# Patient Record
Sex: Female | Born: 1945
Health system: Southern US, Community
[De-identification: ages and names within clinical notes are randomized; demographics above are authoritative.]

## PROBLEM LIST (undated history)

## (undated) DIAGNOSIS — R002 Palpitations: Secondary | ICD-10-CM

## (undated) DIAGNOSIS — R011 Cardiac murmur, unspecified: Secondary | ICD-10-CM

## (undated) DIAGNOSIS — K859 Acute pancreatitis without necrosis or infection, unspecified: Secondary | ICD-10-CM

## (undated) HISTORY — DX: Palpitations: R00.2

## (undated) HISTORY — DX: Acute pancreatitis without necrosis or infection, unspecified: K85.90

## (undated) HISTORY — DX: Cardiac murmur, unspecified: R01.1

---

## 2001-01-17 ENCOUNTER — Other Ambulatory Visit: Admission: RE | Admit: 2001-01-17 | Discharge: 2001-01-17 | Payer: Self-pay | Admitting: Obstetrics and Gynecology

## 2001-08-24 ENCOUNTER — Encounter: Admission: RE | Admit: 2001-08-24 | Discharge: 2001-08-24 | Payer: Self-pay | Admitting: Internal Medicine

## 2001-08-24 ENCOUNTER — Encounter: Payer: Self-pay | Admitting: Internal Medicine

## 2002-01-31 ENCOUNTER — Other Ambulatory Visit: Admission: RE | Admit: 2002-01-31 | Discharge: 2002-01-31 | Payer: Self-pay | Admitting: Obstetrics and Gynecology

## 2002-11-14 DIAGNOSIS — K625 Hemorrhage of anus and rectum: Secondary | ICD-10-CM | POA: Insufficient documentation

## 2003-02-05 ENCOUNTER — Other Ambulatory Visit: Admission: RE | Admit: 2003-02-05 | Discharge: 2003-02-05 | Payer: Self-pay | Admitting: Obstetrics and Gynecology

## 2003-06-26 ENCOUNTER — Encounter: Payer: Self-pay | Admitting: Gastroenterology

## 2003-06-26 ENCOUNTER — Encounter: Admission: RE | Admit: 2003-06-26 | Discharge: 2003-06-26 | Payer: Self-pay | Admitting: Gastroenterology

## 2005-09-05 ENCOUNTER — Other Ambulatory Visit: Admission: RE | Admit: 2005-09-05 | Discharge: 2005-09-05 | Payer: Self-pay | Admitting: Obstetrics and Gynecology

## 2006-09-12 ENCOUNTER — Other Ambulatory Visit: Admission: RE | Admit: 2006-09-12 | Discharge: 2006-09-12 | Payer: Self-pay | Admitting: Obstetrics and Gynecology

## 2012-05-09 ENCOUNTER — Ambulatory Visit: Payer: Self-pay | Admitting: Obstetrics and Gynecology

## 2012-12-04 ENCOUNTER — Other Ambulatory Visit: Payer: Self-pay | Admitting: Obstetrics and Gynecology

## 2012-12-05 ENCOUNTER — Telehealth: Payer: Self-pay | Admitting: Obstetrics and Gynecology

## 2012-12-05 NOTE — Telephone Encounter (Signed)
Tc to pt. Pt informed rx e-pres to pharm this am. Pt agrees.

## 2013-01-24 ENCOUNTER — Ambulatory Visit: Payer: Self-pay | Admitting: Obstetrics and Gynecology

## 2013-06-14 DIAGNOSIS — K635 Polyp of colon: Secondary | ICD-10-CM | POA: Insufficient documentation

## 2016-11-28 ENCOUNTER — Ambulatory Visit (INDEPENDENT_AMBULATORY_CARE_PROVIDER_SITE_OTHER): Payer: Medicare Other | Admitting: Family Medicine

## 2016-11-28 ENCOUNTER — Encounter: Payer: Self-pay | Admitting: Family Medicine

## 2016-11-28 VITALS — BP 118/80 | HR 73 | Resp 12 | Ht 62.0 in | Wt 117.2 lb

## 2016-11-28 DIAGNOSIS — A6000 Herpesviral infection of urogenital system, unspecified: Secondary | ICD-10-CM | POA: Insufficient documentation

## 2016-11-28 DIAGNOSIS — Z0000 Encounter for general adult medical examination without abnormal findings: Secondary | ICD-10-CM

## 2016-11-28 MED ORDER — ACYCLOVIR 400 MG PO TABS: 400.0000 mg | ORAL_TABLET | Freq: Three times a day (TID) | ORAL | 1 refills | Status: DC

## 2016-11-28 MED ORDER — ACYCLOVIR 400 MG PO TABS
400.0000 mg | ORAL_TABLET | Freq: Three times a day (TID) | ORAL | 1 refills | Status: DC
Start: 2016-11-28 — End: 2016-11-28

## 2016-11-28 NOTE — Progress Notes (Signed)
HPI:   [name] is a 71 y.o. female, who is here today to establish care with me.  Former PCP: Dr Teresa Mcknight. Last preventive routine visit: 10/2016. Last colonoscopy 5 years ago, she is due 10/2017.  She exercises regularly and follows a healthy diet, she is a veggan. She is reporting lab work done within the past year and denies any history of hyperlipidemia or diabetes.  Concerns today: Rx for Valtrex.   + Hx of recurrent genital herpes 1-2 episodes per year, Acyclovir is effective in preventing outbreak. Dx about 15 years ago.  -She is requesting medication to promote eye lashes gowth. She has noted for the past 6 months that her eye lashes are "falling." She denies Hx of alopecia. She has not noted erythema, tenderness, or pruritus.  She followed with ophthalmologist and according to pt, she is "glaucoma suspect", last f/u within a year ago. She has not established with eye care provider, her former one is not longer accepting her health insurance.   She lives with sister and brother, who are also independent, both with hs of multiple myeloma.  Independent ADL's and IADL's. No falls in the past year and denies depression symptoms.   Review of Systems  Constitutional: Negative for activity change, appetite change, fatigue, fever and unexpected weight change.  HENT: Negative for mouth sores, nosebleeds, sore throat and trouble swallowing.   Eyes: Negative for pain, discharge, redness and visual disturbance.  Respiratory: Negative for cough, shortness of breath and wheezing.   Cardiovascular: Negative for chest pain, palpitations and leg swelling.  Gastrointestinal: Negative for abdominal pain, nausea and vomiting.       Negative for changes in bowel habits.  Genitourinary: Negative for decreased urine volume, difficulty urinating and hematuria.  Musculoskeletal: Negative for back pain.  Skin: Negative for rash.  Neurological: Negative for syncope, weakness  and headaches.  Psychiatric/Behavioral: Negative for confusion. The patient is not nervous/anxious.       No current outpatient prescriptions on file prior to visit.   No current facility-administered medications on file prior to visit.      Past Medical History:  Diagnosis Date  . Heart murmur    Not on File  Family History  Problem Relation Age of Onset  . Cancer Neg Hx   . Diabetes Neg Hx     Social History   Social History  . Marital status: Single    Spouse name: N/A  . Number of children: N/A  . Years of education: N/A   Social History Main Topics  . Smoking status: Never Smoker  . Smokeless tobacco: Never Used  . Alcohol use No  . Drug use: No  . Sexual activity: Not Asked   Other Topics Concern  . None   Social History Narrative  . None    Vitals:   11/28/16 0819  BP: 118/80  Pulse: 73  Resp: 12   Body mass index is 21.45 kg/m.  Physical Exam  Nursing note and vitals reviewed. Constitutional: She is oriented to person, place, and time. She appears well-developed and well-nourished. No distress.  HENT:  Head: Atraumatic.  Mouth/Throat: Oropharynx is clear and moist and mucous membranes are normal.  Eyes: Conjunctivae, EOM and lids are normal. Pupils are equal, round, and reactive to light.  Neck: No thyroid mass and no thyromegaly present.  Cardiovascular: Normal rate and regular rhythm.   No murmur heard. Pulses:      Dorsalis pedis pulses are 2+ on  the right side, and 2+ on the left side.  Respiratory: Effort normal and breath sounds normal. No respiratory distress.  GI: Soft. She exhibits no mass. There is no hepatomegaly. There is no tenderness.  Musculoskeletal: She exhibits no edema.  Lymphadenopathy:    She has no cervical adenopathy.  Neurological: She is alert and oriented to person, place, and time. She has normal strength. Coordination and gait normal.  Skin: Skin is warm. No erythema.  Psychiatric: She has a normal mood and  affect.  Well groomed, good eye contact.      ASSESSMENT AND PLAN:     Teresa Mcknight was seen today for establish care.  Diagnoses and all orders for this visit:  Recurrent genital herpes  Stable. No changes in current management. F/U in 12 months.  -     acyclovir (ZOVIRAX) 400 MG tablet; Take 1 tablet (400 mg total) by mouth 3 (three) times daily. Prn for flare ups,start < 48 hrs after sx onset and for 5 days.  Health care maintenance  Next Medicare preventive 10/2017. She refused vaccines, not up to date (zoster,Pneumonia, and influenza). Fall prevention discussed.  In regard to prescription requested for eyelashes growth I would prefer for her eye care provider to prescribe, given her history of possible glaucoma. Encouraged to arrange eye exam appointment.     Teresa Shankles G. [place], MD  [facility]. [place] office.

## 2016-11-28 NOTE — Progress Notes (Signed)
Pre visit review using our clinic review tool, if applicable. No additional management support is needed unless otherwise documented below in the visit note. 

## 2016-11-28 NOTE — Patient Instructions (Signed)
A few things to remember from today's visit:   Recurrent genital herpes - Plan: acyclovir (ZOVIRAX) 400 MG tablet  Continue Acyclovir as needed, start medicating as soon as symptoms of herpes start, less than 48 hours and take it for 5 days at the time. This is not to take daily just when you have a flare up.   Medicare covers a annual preventive visit, which is strongly recommended , it is once per year and involves a series of questions to identify risk factors; so we can try to prevent possible complications. This does not need to be done by a doctor.  We have a nurse Banker(RN) here that is highly qualified to do it, it can be arrange same date you have a follow up appointment with me or labs scheduled, and it 100% covered by Medicare. So before you leave today I would like for you to arrange visit with Teresa Mcknight for Medicare wellness visit.   Please be sure medication list is accurate. If a new problem present, please set up appointment sooner than planned today.

## 2017-11-25 ENCOUNTER — Encounter: Payer: Self-pay | Admitting: Hematology and Oncology

## 2018-10-23 ENCOUNTER — Encounter: Payer: Self-pay | Admitting: Hematology and Oncology

## 2018-10-23 ENCOUNTER — Telehealth: Payer: Self-pay | Admitting: Hematology and Oncology

## 2018-10-23 NOTE — Telephone Encounter (Signed)
New referral received from Dr. Dierdre ForthVanessa [facility]. Pt has been cld and scheduled to see Dr. Pamelia HoitGudena on 1/6 at 1pm. Pt agreed to the appt date and time. Letter mailed.

## 2018-11-14 HISTORY — PX: COLONOSCOPY: SHX174

## 2018-11-19 ENCOUNTER — Telehealth: Payer: Self-pay | Admitting: Hematology and Oncology

## 2018-11-19 ENCOUNTER — Inpatient Hospital Stay: Payer: Medicare Other

## 2018-11-19 ENCOUNTER — Inpatient Hospital Stay: Payer: Medicare Other | Attending: Hematology and Oncology | Admitting: Hematology and Oncology

## 2018-11-19 ENCOUNTER — Other Ambulatory Visit: Payer: Self-pay

## 2018-11-19 DIAGNOSIS — D696 Thrombocytopenia, unspecified: Secondary | ICD-10-CM | POA: Diagnosis not present

## 2018-11-19 DIAGNOSIS — D709 Neutropenia, unspecified: Secondary | ICD-10-CM

## 2018-11-19 DIAGNOSIS — Z79899 Other long term (current) drug therapy: Secondary | ICD-10-CM | POA: Diagnosis not present

## 2018-11-19 LAB — FOLATE: FOLATE: 7.3 ng/mL (ref >5.9)

## 2018-11-19 LAB — CBC WITH DIFFERENTIAL (CANCER CENTER ONLY)
Abs Immature Granulocytes: 0.01 10*3/uL (ref 0.00–0.07)
Basophils Absolute: 0 10*3/uL (ref 0.0–0.1)
Basophils Relative: 0 %
Eosinophils Absolute: 0.3 10*3/uL (ref 0.0–0.5)
Eosinophils Relative: 7 %
HCT: 40.5 % (ref 36.0–46.0)
Hemoglobin: 13 g/dL (ref 12.0–15.0)
Immature Granulocytes: 0 %
Lymphocytes Relative: 47 %
Lymphs Abs: 1.9 10*3/uL (ref 0.7–4.0)
MCH: 30.8 pg (ref 26.0–34.0)
MCHC: 32.1 g/dL (ref 30.0–36.0)
MCV: 96 fL (ref 80.0–100.0)
MONO ABS: 0.3 10*3/uL (ref 0.1–1.0)
Monocytes Relative: 7 %
Neutro Abs: 1.6 10*3/uL — ABNORMAL LOW (ref 1.7–7.7)
Neutrophils Relative %: 39 %
Platelet Count: 132 10*3/uL — ABNORMAL LOW (ref 150–400)
RBC: 4.22 MIL/uL (ref 3.87–5.11)
RDW: 14.6 % (ref 11.5–15.5)
WBC Count: 4 10*3/uL (ref 4.0–10.5)
nRBC: 0 % (ref 0.0–0.2)

## 2018-11-19 LAB — VITAMIN B12: Vitamin B-12: 5858 pg/mL — ABNORMAL HIGH (ref 180–914)

## 2018-11-19 NOTE — Assessment & Plan Note (Signed)
Mild neutropenia: Lab done on 10/04/2018 WBC 3.4, ANC 1.4, platelet count 133, hemoglobin 12.1 I will request for additional labs to compare these levels with historical levels. Before embarking on an extensive work-up, I would like to repeat the CBC with differential today.  Differential diagnosis: 1. Infections: Especially viruses like HIV 2. inflammation/autoimmune causes including lupus/rheumatoid arthritis 3. Nutritional causes but V-07 or folic acid deficiency 4. Medication induced 5. Bone marrow disorders  Workup today: 1. CBC with differential with smear 2. A-17 and folic acid levels 3. ANA 4. Flow cytometry  Return to clinic in 2 weeks to discuss the results and consider bone marrow biopsy if necessary.

## 2018-11-19 NOTE — Progress Notes (Signed)
[place] CONSULT NOTE  Patient Care Team: Haygood, Seymour Bars, MD as PCP - General (Obstetrics and Gynecology)  CHIEF COMPLAINTS/PURPOSE OF CONSULTATION:  Neutropenia and thrombocytopenia  HISTORY OF PRESENTING ILLNESS:  Teresa Mcknight 73 y.o. female is here because of recent diagnosis of mild neutropenia and thrombocytopenia.  Patient had routine blood work that showed an [place] of 1.4 with a total white count of 3.4 and a platelet count of 133.  She brought blood work from 2016 which she had a white count of 3.2 without any differential.  Her platelet count was 12/02/2014.  She takes acyclovir for herpes simplex prevention and has been on it for the past 8 years.  She has not noticed any worsening symptoms of bruising or bleeding or any excessive infections.  She does not eat needs and therefore takes vitamin B12 supplementation.  She is a retired Chief Executive Officer.  I reviewed her records extensively and collaborated the history with the patient.  MEDICAL HISTORY: Herpes simplex type II infections, colon polyps, uterine fibroids, hypercholesterolemia, osteopenia Past Medical History:  Diagnosis Date  . Heart murmur     SURGICAL HISTORY: D&C, colonoscopy 2014 SOCIAL HISTORY: Patient lives by herself, denies any tobacco alcohol or recreational drug use. FAMILY HISTORY: Mother had dementia, sister had hypothyroidism Family History  Problem Relation Age of Onset  . Cancer Neg Hx   . Diabetes Neg Hx     ALLERGIES:  has no allergies on file.  MEDICATIONS:  Current Outpatient Medications  Medication Sig Dispense Refill  . acyclovir (ZOVIRAX) 400 MG tablet Take 1 tablet (400 mg total) by mouth 3 (three) times daily. Prn for flare ups,start < 48 hrs after sx onset and for 5 days. 30 tablet 1   No current facility-administered medications for this visit.   Multivitamin and B12  REVIEW OF SYSTEMS:   Constitutional: Denies fevers, chills or abnormal night sweats Eyes: Denies  blurriness of vision, double vision or watery eyes Ears, nose, mouth, throat, and face: Denies mucositis or sore throat Respiratory: Denies cough, dyspnea or wheezes Cardiovascular: Denies palpitation, chest discomfort or lower extremity swelling Gastrointestinal:  Denies nausea, heartburn or change in bowel habits Skin: Denies abnormal skin rashes Lymphatics: Denies new lymphadenopathy or easy bruising Neurological:Denies numbness, tingling or new weaknesses Behavioral/Psych: Mood is stable, no new changes  Breast: Patient complains that her breasts are different sizes but prior mammograms were normal All other systems were reviewed with the patient and are negative.  PHYSICAL EXAMINATION: ECOG PERFORMANCE STATUS: 0 - Asymptomatic  There were no vitals filed for this visit. There were no vitals filed for this visit.  GENERAL:alert, no distress and comfortable SKIN: skin color, texture, turgor are normal, no rashes or significant lesions EYES: normal, conjunctiva are pink and non-injected, sclera clear OROPHARYNX:no exudate, no erythema and lips, buccal mucosa, and tongue normal  NECK: supple, thyroid normal size, non-tender, without nodularity LYMPH:  no palpable lymphadenopathy in the cervical, axillary or inguinal LUNGS: clear to auscultation and percussion with normal breathing effort HEART: regular rate & rhythm and no murmurs and no lower extremity edema ABDOMEN:abdomen soft, non-tender and normal bowel sounds Musculoskeletal:no cyanosis of digits and no clubbing  PSYCH: alert & oriented x 3 with fluent speech NEURO: no focal motor/sensory deficits   ASSESSMENT AND PLAN:  Neutropenia (HCC) and thrombocytopenia Mild neutropenia: Lab done on 10/04/2018 WBC 3.4, ANC 1.4, platelet count 133, hemoglobin 12.1 I will request for additional labs to compare these levels with historical levels. Before  embarking on an extensive work-up, I would like to repeat the CBC with differential  today.  Differential diagnosis: 1. Infections: Especially viruses like HIV 2. inflammation/autoimmune causes including lupus/rheumatoid arthritis or ITP 3. Nutritional causes but B-91 or folic acid deficiency 4. Medication induced 5. Bone marrow disorders  Workup today: 1. CBC with differential with smear 2. L-68 and folic acid levels 3. ANA If her blood counts remain stable, we may elect to watch and monitor the blood counts every 6 months.  Different sizes of breast: Patient would like me to do a breast exam when she comes back to see me in a week.  Return to clinic in 1 weeks to discuss the results and consider bone marrow biopsy if necessary.      All questions were answered. The patient knows to call the clinic with any problems, questions or concerns.    Harriette Ohara, MD 11/19/18

## 2018-11-19 NOTE — Telephone Encounter (Signed)
Patient decline avs and calendar °

## 2018-11-20 LAB — ANTINUCLEAR ANTIBODIES, IFA: ANA Ab, IFA: NEGATIVE

## 2018-11-21 ENCOUNTER — Encounter: Payer: Self-pay | Admitting: Cardiovascular Disease

## 2018-11-21 ENCOUNTER — Ambulatory Visit (INDEPENDENT_AMBULATORY_CARE_PROVIDER_SITE_OTHER): Payer: Medicare Other | Admitting: Cardiovascular Disease

## 2018-11-21 ENCOUNTER — Encounter

## 2018-11-21 VITALS — BP 114/72 | HR 76 | Ht 65.0 in | Wt 119.6 lb

## 2018-11-21 DIAGNOSIS — R946 Abnormal results of thyroid function studies: Secondary | ICD-10-CM

## 2018-11-21 DIAGNOSIS — R002 Palpitations: Secondary | ICD-10-CM | POA: Insufficient documentation

## 2018-11-21 HISTORY — DX: Palpitations: R00.2

## 2018-11-21 NOTE — Progress Notes (Signed)
Cardiology Office Note   Date:  11/21/2018   ID:  Teresa Mcknight, DOB [birth year], MRN [id number]  PCP:  Hal Morales, MD  Cardiologist:   Chilton Si, MD   Chief Complaint  Patient presents with  . Follow-up      History of Present Illness: Teresa Mcknight is a 73 y.o. female who is being seen today for the evaluation of palpitations at the request of Haygood, Maris Berger, MD.  Teresa Mcknight reports intermittent episodes of feeling like her heart is beating hard.  It always occurs at rest and typically when she awakens from sleep.  It doesn't feel as though her heart is racing or irregular.  She feels her heart beating in the back of her throat and it makes her feel like she is gagging.  It seems to occur randomly.  The most recent episode occurred earlier this week, but it often doesn't occur for several months at a time.  The episodes last for a couple minutes at a time.  They are not associated with chest pain, shortness of breath, lightheadedness or dizziness.  She does at times feel a different sensation that she distinguishes as palpitations.  This occurs with exertion and it feels like her heart is beating fast.  This occurs very rarely.  Teresa Mcknight does yoga three days per week and feels good with exercise.  She has no exertional chest pain, shortness of breath or dizziness.  She follows a healthy diet and has been Vegan for the last 5 years.  At times she has LE edema after sitting for long periods of time but no orthopnea or PND.  She drinks approximately 2 cups of tea daily and doesn't use any over the counter cold or cough medications.  She hasn't been feeling anxious or stressed.  When she was younger she was told that she had an enlarged heart.  She saw a heart specialist >10 years ago and no enlarged heart on echo.  ETT was normal.      Past Medical History:  Diagnosis Date  . Heart murmur   . Palpitations 11/21/2018    History reviewed. No pertinent surgical  history.   Current Outpatient Medications  Medication Sig Dispense Refill  . acyclovir (ZOVIRAX) 400 MG tablet Take 1 tablet (400 mg total) by mouth 3 (three) times daily. Prn for flare ups,start < 48 hrs after sx onset and for 5 days. 30 tablet 1  . Ascorbic Acid (VITAMIN C PO) Take by mouth.    . Menaquinone-7 (VITAMIN K2 PO) Take by mouth daily.    Marland Kitchen VITAMIN B1-B12 PO Take by mouth.     No current facility-administered medications for this visit.     Allergies:   Patient has no allergy information on record.    Social History:  The patient  reports that she has never smoked. She has never used smokeless tobacco. She reports that she does not drink alcohol or use drugs.   Family History:  The patient's family history includes Dementia in her maternal grandmother and mother; Hyperthyroidism in her sister; Hypothyroidism in her brother; Multiple myeloma in her brother and sister; Stroke in her father and mother.    ROS:  Please see the history of present illness.   Otherwise, review of systems are positive for none.   All other systems are reviewed and negative.    PHYSICAL EXAM: VS:  BP 114/72   Pulse 76   Ht 5\' 5"  (1.651 m)  Wt 119 lb 9.6 oz (54.3 kg)   BMI 19.90 kg/m  , BMI Body mass index is 19.9 kg/m. GENERAL:  Well appearing HEENT:  Pupils equal round and reactive, fundi not visualized, oral mucosa unremarkable NECK:  No jugular venous distention, waveform within normal limits, carotid upstroke brisk and symmetric, L carotid bruits LUNGS:  Clear to auscultation bilaterally HEART:  RRR.  PMI not displaced or sustained,S1 and S2 within normal limits, no S3, no S4, no clicks, no rubs, no murmurs ABD:  Flat, positive bowel sounds normal in frequency in pitch, no bruits, no rebound, no guarding, no midline pulsatile mass, no hepatomegaly, no splenomegaly EXT:  2 plus pulses throughout, no edema, no cyanosis no clubbing SKIN:  No rashes no nodules NEURO:  Cranial nerves II  through XII grossly intact, motor grossly intact throughout PSYCH:  Cognitively intact, oriented to person place and time   EKG:  EKG is ordered today. The ekg ordered today demonstrates sinus rhythm.  Rate 76 bpm.   Recent Labs: 11/19/2018: Hemoglobin 13.0; Platelet Count 132   10/04/18: Hemoglobin A1C: 5.4% TSH 4.87 WBC 3.4, hgb 12.1, plt 133 Sodium 145, potassium 4.3, BUN 5, creatinine 0.89 AST 21, ALT 8   Lipid Panel No results found for: CHOL, TRIG, HDL, CHOLHDL, VLDL, LDLCALC, LDLDIRECT    Wt Readings from Last 3 Encounters:  11/21/18 119 lb 9.6 oz (54.3 kg)  11/19/18 120 lb 6.4 oz (54.6 kg)  11/28/16 117 lb 4 oz (53.2 kg)      ASSESSMENT AND PLAN:  # Palpitations: # Elevated TSH: Teresa Mcknight has been awakened intermittently by palpitations.  They occur sporadically less than once per month.  We discussed wearing a monitor, but we are unlikely to catch anything on even a 30 day monitor.  For now she would prefer not to wear a monitor.  Dr. Pennie Rushing checked her thyroid function 09/2018 and TSH was mildly elevated.  We will repeat TSH and free T4.  Other labs were unremarkable and we will not repeat them at this time. We discussed the importance of notifying us if she develops symptoms that are associated with shortness of breath, chest pain, lightheadedness or exertional symptoms.  # L carotid bruit: Check carotid Dopplers.   Current medicines are reviewed at length with the patient today.  The patient does not have concerns regarding medicines.  The following changes have been made:  no change  Labs/ tests ordered today include:   Orders Placed This Encounter  Procedures  . T4, free  . TSH  . EKG 12-Lead     Disposition:   FU with Cap Massi C. Duke Salvia, MD, [facility] in 1 year.     Signed, Chester Romero C. Duke Salvia, MD, [facility]  11/21/2018 10:16 PM    [facility]

## 2018-11-21 NOTE — Patient Instructions (Signed)
Medication Instructions:  No changes  If you need a refill on your cardiac medications before your next appointment, please call your pharmacy.   Lab work: TSH/FT4 TODAY   If you have labs (blood work) drawn today and your tests are completely normal, you will receive your results only by: Marland Kitchen MyChart Message (if you have MyChart) OR . A paper copy in the mail If you have any lab test that is abnormal or we need to change your treatment, we will call you to review the results.  Testing/Procedures: NONE  Follow-Up: At [facility], you and your health needs are our priority.  As part of our continuing mission to provide you with exceptional heart care, we have created designated Provider Care Teams.  These Care Teams include your primary Cardiologist (physician) and Advanced Practice Providers (APPs -  Physician Assistants and Nurse Practitioners) who all work together to provide you with the care you need, when you need it. You will need a follow up appointment in 12 months.  Please call our office 2 months in advance to schedule this appointment.  You may see DR RANDOOLH or one of the following Advanced Practice Providers on your designated Care Team:   Corine Shelter, PA-C [place], [place] . Marjie Skiff, PA-C

## 2018-11-22 LAB — TSH: TSH: 2.03 u[IU]/mL (ref 0.450–4.500)

## 2018-11-22 LAB — T4, FREE: Free T4: 0.89 ng/dL (ref 0.82–1.77)

## 2018-11-28 NOTE — Progress Notes (Signed)
Patient Care Team: Eldred Manges, MD as PCP - General (Obstetrics and Gynecology)  DIAGNOSIS:    ICD-10-CM   1. Neutropenia, unspecified type ([place]) D70.9 CBC with Differential ([facility] Only)    CHIEF COMPLIANT: Follow-up of Neutropenia and thrombocytopenia  INTERVAL HISTORY: Teresa Mcknight is a 73 y.o. with above-mentioned history of neutropenia and thrombocytopenia. She presents to the clinic alone today to discuss recent lab work which was negative for lupus or thyroid issues, platelets 132, neutrophils 1.6. She reports she is vegan and takes a B12 supplement and Vitamin K supplement for bone health but will switch to Vitamin D. She expressed interest in having a breast exam today because her breasts are different sizes and feel heavy. Her most recent mammogram from 11/25/17 showed no evidence of malignancy. She prefers to follow-up here in a year as opposed to with her PCP. She reviewed her medication list with me.  REVIEW OF SYSTEMS:   Constitutional: Denies fevers, chills or abnormal weight loss Eyes: Denies blurriness of vision Ears, nose, mouth, throat, and face: Denies mucositis or sore throat Respiratory: Denies cough, dyspnea or wheezes Cardiovascular: Denies palpitation, chest discomfort Gastrointestinal:  Denies nausea, heartburn or change in bowel habits Skin: Denies abnormal skin rashes Lymphatics: Denies new lymphadenopathy or easy bruising Neurological: Denies numbness, tingling or new weaknesses Behavioral/Psych: Mood is stable, no new changes  Extremities: No lower extremity edema Breast: denies any pain or lumps or nodules in either breasts All other systems were reviewed with the patient and are negative.  I have reviewed the past medical history, past surgical history, social history and family history with the patient and they are unchanged from previous note.  ALLERGIES:  has no allergies on file.  MEDICATIONS:  Current Outpatient Medications    Medication Sig Dispense Refill  . acyclovir (ZOVIRAX) 400 MG tablet Take 1 tablet (400 mg total) by mouth 3 (three) times daily. Prn for flare ups,start < 48 hrs after sx onset and for 5 days. 30 tablet 1  . Ascorbic Acid (VITAMIN C PO) Take by mouth.    Marland Kitchen VITAMIN B1-B12 PO Take by mouth.     No current facility-administered medications for this visit.     PHYSICAL EXAMINATION: ECOG PERFORMANCE STATUS: 0 - Asymptomatic  Vitals:   11/30/18 0904  BP: 131/75  Pulse: 75  Resp: 17  Temp: 97.8 F (36.6 C)  SpO2: 100%   Filed Weights   11/30/18 0904  Weight: 119 lb 4.8 oz (54.1 kg)    GENERAL: alert, no distress and comfortable SKIN: skin color, texture, turgor are normal, no rashes or significant lesions EYES: normal, Conjunctiva are pink and non-injected, sclera clear OROPHARYNX: no exudate, no erythema and lips, buccal mucosa, and tongue normal  NECK: supple, thyroid normal size, non-tender, without nodularity LYMPH: no palpable lymphadenopathy in the cervical, axillary or inguinal LUNGS: clear to auscultation and percussion with normal breathing effort HEART: regular rate & rhythm and no murmurs and no lower extremity edema ABDOMEN: abdomen soft, non-tender and normal bowel sounds MUSCULOSKELETAL: no cyanosis of digits and no clubbing  NEURO: alert & oriented x 3 with fluent speech, no focal motor/sensory deficits EXTREMITIES: No lower extremity edema BREAST: No palpable masses or nodules in either right or left breasts. No palpable axillary supraclavicular or infraclavicular adenopathy no breast tenderness or nipple discharge. (exam performed in the presence of a chaperone)  LABORATORY DATA:  I have reviewed the data as listed No flowsheet data found.  Lab Results  Component Value Date   WBC 4.0 11/19/2018   HGB 13.0 11/19/2018   HCT 40.5 11/19/2018   MCV 96.0 11/19/2018   PLT 132 (L) 11/19/2018   NEUTROABS 1.6 (L) 11/19/2018    ASSESSMENT & PLAN:  Neutropenia  (HCC) Mild neutropenia:  10/04/2018: WBC 3.4, ANC 1.4, platelet count 133, hemoglobin 12.1 11/19/2018: WBC 4, ANC 1.6, platelets 132, hemoglobin 13, few atypical lymphs ANA: Negative, O24 and folic acid were normal, TSH and thyroid function: Normal  I discussed with the patient that there has been an improvement in the total white blood cell count and ANC. Since the decrease in the [place] is so mild, I do not recommend any other interventions at this time. However if the ANC drops below 1.0, we will need to do a bone marrow biopsy.  Breast issues: Patient complains that 1 of the breast is larger than the other. Mammogram 11/21/2018: Benign breast density category B Exam findings did not reveal any particular cause of concern. I recommended watchful monitoring with annual mammograms.  Return to clinic on an as-needed basis      Orders Placed This Encounter  Procedures  . CBC with Differential ([facility] Only)    Standing Status:   Future    Standing Expiration Date:   12/01/2019   The patient has a good understanding of the overall plan. she agrees with it. she will call with any problems that may develop before the next visit here.  Nicholas Lose, MD 11/30/2018  Julious Oka Dorshimer am acting as scribe for Dr. Nicholas Lose.  I have reviewed the above documentation for accuracy and completeness, and I agree with the above.

## 2018-11-30 ENCOUNTER — Telehealth: Payer: Self-pay | Admitting: Cardiovascular Disease

## 2018-11-30 ENCOUNTER — Inpatient Hospital Stay (HOSPITAL_BASED_OUTPATIENT_CLINIC_OR_DEPARTMENT_OTHER): Payer: Medicare Other | Admitting: Hematology and Oncology

## 2018-11-30 ENCOUNTER — Telehealth: Payer: Self-pay | Admitting: Hematology and Oncology

## 2018-11-30 DIAGNOSIS — D696 Thrombocytopenia, unspecified: Secondary | ICD-10-CM | POA: Diagnosis not present

## 2018-11-30 DIAGNOSIS — Z79899 Other long term (current) drug therapy: Secondary | ICD-10-CM

## 2018-11-30 DIAGNOSIS — D709 Neutropenia, unspecified: Secondary | ICD-10-CM

## 2018-11-30 NOTE — Telephone Encounter (Signed)
Patient is calling because she has not heard from anyone regarding her test results

## 2018-11-30 NOTE — Telephone Encounter (Signed)
Gave avs and calendar ° °

## 2018-11-30 NOTE — Assessment & Plan Note (Addendum)
Mild neutropenia:  10/04/2018: WBC 3.4, ANC 1.4, platelet count 133, hemoglobin 12.1 11/19/2018: WBC 4, ANC 1.6, platelets 132, hemoglobin 13, few atypical lymphs ANA: Negative, O60 and folic acid were normal, TSH and thyroid function: Normal  I discussed with the patient that there has been an improvement in the total white blood cell count and ANC. Since the decrease in the [place] is so mild, I do not recommend any other interventions at this time. However if the ANC drops below 1.0, we will need to do a bone marrow biopsy.  Breast issues: Patient complains that 1 of the breast is larger than the other. Mammogram 11/21/2018: Benign breast density category B Exam findings did not reveal any particular cause of concern. I recommended watchful monitoring with annual mammograms.  Return to clinic on an as-needed basis

## 2018-11-30 NOTE — Telephone Encounter (Signed)
Returned call to patient advised recent thyroid labs normal.Patient requested copy of results to be mailed.Results mailed.

## 2018-12-05 ENCOUNTER — Telehealth: Payer: Self-pay | Admitting: Cardiovascular Disease

## 2018-12-05 NOTE — Telephone Encounter (Signed)
Returned call to patient no answer.LMTC. 

## 2018-12-05 NOTE — Telephone Encounter (Signed)
New Message:    Please call, question about her lab work she had on the 9th of January she thinks.

## 2018-12-07 NOTE — Telephone Encounter (Signed)
Called patient, she would like another letter with ranges.  I will mail about the results to the patient. No other questions.

## 2019-03-26 ENCOUNTER — Telehealth: Payer: Self-pay

## 2019-03-26 NOTE — Telephone Encounter (Signed)
   [facility] Pre-operative Risk Assessment    Request for surgical clearance:  1. What type of surgery is being performed? Colonoscopy    2. When is this surgery scheduled? 04/03/19  3. What type of clearance is required (medical clearance vs. Pharmacy clearance to hold med vs. Both)? Medical  4. Are there any medications that need to be held prior to surgery and how long? N/A  5. Practice name and name of physician performing surgery? [facility] (Dr. Oletta Lamas)  6. What is your office phone number [phone]    7.   What is your office fax number [phone]  8.   Anesthesia type (None, local, MAC, general) ? Propofol    Meryl Crutch 03/26/2019, 10:08 AM  _________________________________________________________________   (provider comments below)

## 2019-03-26 NOTE — Telephone Encounter (Signed)
   Primary Cardiologist: Chilton Si, MD  Chart reviewed as part of pre-operative protocol coverage. Patient was contacted 03/26/2019 in reference to pre-operative risk assessment for pending surgery as outlined below.  Teresa Mcknight was last seen on 11/21/18 by Dr. Duke Salvia.  Since that day, Teresa Mcknight has done well. She does not have a history of heart disease or heart failure. She denies new cardiac symptoms, including angina. She can complete more than 4.0 METS. She is low risk for colonoscopy.  Therefore, based on ACC/AHA guidelines, the patient would be at acceptable risk for the planned procedure without further cardiovascular testing.   I will route this recommendation to the requesting party via Epic fax function and remove from pre-op pool.  Please call with questions.  Roe Rutherford Duke, PA 03/26/2019, 10:23 AM

## 2019-06-17 ENCOUNTER — Other Ambulatory Visit: Payer: Self-pay

## 2019-06-18 ENCOUNTER — Telehealth: Payer: Self-pay

## 2019-06-18 NOTE — Telephone Encounter (Signed)
Erroneous entry

## 2019-06-19 ENCOUNTER — Telehealth: Payer: Self-pay

## 2019-06-19 NOTE — Telephone Encounter (Signed)
Pt called to request appointment regarding "swelling in lymph nodes on left side."  Pt denies any fever, fatigue, or recent illness. Pt reports swelling goes down once she takes Ibuprofen.  Reports symptoms have been present for several weeks.

## 2019-06-19 NOTE — Telephone Encounter (Signed)
Patient scheduled to follow up with MD regarding lymph node swelling.  Pt aware.

## 2019-06-20 ENCOUNTER — Inpatient Hospital Stay (HOSPITAL_COMMUNITY): Payer: Medicare Other

## 2019-06-20 ENCOUNTER — Emergency Department (HOSPITAL_COMMUNITY): Payer: Medicare Other

## 2019-06-20 ENCOUNTER — Encounter (HOSPITAL_COMMUNITY): Payer: Self-pay | Admitting: *Deleted

## 2019-06-20 ENCOUNTER — Inpatient Hospital Stay (HOSPITAL_COMMUNITY)
Admission: EM | Admit: 2019-06-20 | Discharge: 2019-06-24 | DRG: 440 | Disposition: A | Discharge status: Home / Self Care | Payer: Medicare Other | Attending: Internal Medicine | Admitting: Internal Medicine

## 2019-06-20 ENCOUNTER — Encounter (HOSPITAL_COMMUNITY): Payer: Self-pay

## 2019-06-20 ENCOUNTER — Other Ambulatory Visit: Payer: Self-pay

## 2019-06-20 ENCOUNTER — Ambulatory Visit (INDEPENDENT_AMBULATORY_CARE_PROVIDER_SITE_OTHER)
Admission: EM | Admit: 2019-06-20 | Discharge: 2019-06-20 | Disposition: A | Discharge status: Home / Self Care | Payer: Medicare Other | Source: Home / Self Care

## 2019-06-20 DIAGNOSIS — Z807 Family history of other malignant neoplasms of lymphoid, hematopoietic and related tissues: Secondary | ICD-10-CM | POA: Diagnosis not present

## 2019-06-20 DIAGNOSIS — R1013 Epigastric pain: Secondary | ICD-10-CM | POA: Diagnosis not present

## 2019-06-20 DIAGNOSIS — K8591 Acute pancreatitis with uninfected necrosis, unspecified: Secondary | ICD-10-CM | POA: Diagnosis not present

## 2019-06-20 DIAGNOSIS — Z91011 Allergy to milk products: Secondary | ICD-10-CM | POA: Diagnosis not present

## 2019-06-20 DIAGNOSIS — E876 Hypokalemia: Secondary | ICD-10-CM | POA: Diagnosis present

## 2019-06-20 DIAGNOSIS — Z888 Allergy status to other drugs, medicaments and biological substances status: Secondary | ICD-10-CM | POA: Diagnosis not present

## 2019-06-20 DIAGNOSIS — K859 Acute pancreatitis without necrosis or infection, unspecified: Secondary | ICD-10-CM | POA: Diagnosis not present

## 2019-06-20 DIAGNOSIS — Z91040 Latex allergy status: Secondary | ICD-10-CM

## 2019-06-20 DIAGNOSIS — Z886 Allergy status to analgesic agent status: Secondary | ICD-10-CM

## 2019-06-20 DIAGNOSIS — Z79899 Other long term (current) drug therapy: Secondary | ICD-10-CM

## 2019-06-20 DIAGNOSIS — K8501 Idiopathic acute pancreatitis with uninfected necrosis: Principal | ICD-10-CM | POA: Diagnosis present

## 2019-06-20 DIAGNOSIS — Z20828 Contact with and (suspected) exposure to other viral communicable diseases: Secondary | ICD-10-CM | POA: Diagnosis present

## 2019-06-20 LAB — CBC
HCT: 40.5 % (ref 36.0–46.0)
Hemoglobin: 13 g/dL (ref 12.0–15.0)
MCH: 31.5 pg (ref 26.0–34.0)
MCHC: 32.1 g/dL (ref 30.0–36.0)
MCV: 98.1 fL (ref 80.0–100.0)
Platelets: 145 10*3/uL — ABNORMAL LOW (ref 150–400)
RBC: 4.13 MIL/uL (ref 3.87–5.11)
RDW: 14.3 % (ref 11.5–15.5)
WBC: 8.5 10*3/uL (ref 4.0–10.5)
nRBC: 0 % (ref 0.0–0.2)

## 2019-06-20 LAB — COMPREHENSIVE METABOLIC PANEL
ALT: 20 U/L (ref 0–44)
AST: 37 U/L (ref 15–41)
Albumin: 3.2 g/dL — ABNORMAL LOW (ref 3.5–5.0)
Alkaline Phosphatase: 82 U/L (ref 38–126)
Anion gap: 12 (ref 5–15)
BUN: 10 mg/dL (ref 8–23)
CO2: 23 mmol/L (ref 22–32)
Calcium: 7.9 mg/dL — ABNORMAL LOW (ref 8.9–10.3)
Chloride: 102 mmol/L (ref 98–111)
Creatinine, Ser: 0.94 mg/dL (ref 0.44–1.00)
GFR calc Af Amer: >60 mL/min (ref >60)
GFR calc non Af Amer: >60 mL/min (ref >60)
Glucose, Bld: 141 mg/dL — ABNORMAL HIGH (ref 70–99)
Potassium: 3.4 mmol/L — ABNORMAL LOW (ref 3.5–5.1)
Sodium: 137 mmol/L (ref 135–145)
Total Bilirubin: 0.7 mg/dL (ref 0.3–1.2)
Total Protein: 6.4 g/dL — ABNORMAL LOW (ref 6.5–8.1)

## 2019-06-20 LAB — URINALYSIS, ROUTINE W REFLEX MICROSCOPIC
Bacteria, UA: NONE SEEN
Bilirubin Urine: NEGATIVE
Glucose, UA: 50 mg/dL — AB
Hgb urine dipstick: NEGATIVE
Ketones, ur: 80 mg/dL — AB
Leukocytes,Ua: NEGATIVE
Nitrite: NEGATIVE
Protein, ur: 100 mg/dL — AB
Specific Gravity, Urine: 1.027 (ref 1.005–1.030)
pH: 6 (ref 5.0–8.0)

## 2019-06-20 LAB — TRIGLYCERIDES: Triglycerides: 49 mg/dL (ref <150)

## 2019-06-20 LAB — SARS CORONAVIRUS 2 (TAT 6-24 HRS): SARS Coronavirus 2: NEGATIVE

## 2019-06-20 LAB — LIPASE, BLOOD: Lipase: 275 U/L — ABNORMAL HIGH (ref 11–51)

## 2019-06-20 MED ORDER — ONDANSETRON HCL 4 MG/2ML IJ SOLN: 4.0000 mg | Freq: Four times a day (QID) | INTRAMUSCULAR | Status: DC | PRN

## 2019-06-20 MED ORDER — ACETAMINOPHEN 325 MG PO TABS
650.0000 mg | ORAL_TABLET | Freq: Four times a day (QID) | ORAL | Status: DC | PRN
  Administered 2019-06-21 – 2019-06-23 (×5): 650 mg via ORAL
  Filled 2019-06-20 (×7): qty 2

## 2019-06-20 MED ORDER — ONDANSETRON HCL 4 MG/2ML IJ SOLN
4.0000 mg | Freq: Once | INTRAMUSCULAR | Status: AC
  Administered 2019-06-20: 4 mg via INTRAVENOUS
  Filled 2019-06-20: qty 2

## 2019-06-20 MED ORDER — MORPHINE SULFATE (PF) 2 MG/ML IV SOLN
2.0000 mg | INTRAVENOUS | Status: DC | PRN
  Administered 2019-06-20: 2 mg via INTRAVENOUS
  Filled 2019-06-20: qty 1

## 2019-06-20 MED ORDER — ACETAMINOPHEN 650 MG RE SUPP: 650.0000 mg | Freq: Four times a day (QID) | RECTAL | Status: DC | PRN

## 2019-06-20 MED ORDER — SODIUM CHLORIDE 0.9 % IV SOLN
INTRAVENOUS | Status: DC
  Administered 2019-06-20 – 2019-06-23 (×5): via INTRAVENOUS

## 2019-06-20 MED ORDER — POLYETHYLENE GLYCOL 3350 17 G PO PACK: 17.0000 g | PACK | Freq: Every day | ORAL | Status: DC | PRN

## 2019-06-20 MED ORDER — FENTANYL CITRATE (PF) 100 MCG/2ML IJ SOLN
50.0000 ug | Freq: Once | INTRAMUSCULAR | Status: AC
  Administered 2019-06-20: 12:00:00 50 ug via INTRAVENOUS
  Filled 2019-06-20: qty 2

## 2019-06-20 MED ORDER — SENNA 8.6 MG PO TABS
1.0000 | ORAL_TABLET | Freq: Two times a day (BID) | ORAL | Status: DC
  Administered 2019-06-20 – 2019-06-23 (×2): 8.6 mg via ORAL
  Filled 2019-06-20 (×5): qty 1

## 2019-06-20 MED ORDER — SODIUM CHLORIDE 0.9 % IV BOLUS
500.0000 mL | Freq: Once | INTRAVENOUS | Status: AC
  Administered 2019-06-20: 500 mL via INTRAVENOUS

## 2019-06-20 MED ORDER — ONDANSETRON HCL 4 MG PO TABS: 4.0000 mg | ORAL_TABLET | Freq: Four times a day (QID) | ORAL | Status: DC | PRN

## 2019-06-20 MED ORDER — POTASSIUM CHLORIDE CRYS ER 20 MEQ PO TBCR
40.0000 meq | EXTENDED_RELEASE_TABLET | Freq: Once | ORAL | Status: AC
  Administered 2019-06-20: 40 meq via ORAL
  Filled 2019-06-20: qty 2

## 2019-06-20 MED ORDER — SODIUM CHLORIDE 0.9% FLUSH
3.0000 mL | Freq: Once | INTRAVENOUS | Status: AC
  Administered 2019-06-20: 3 mL via INTRAVENOUS

## 2019-06-20 MED ORDER — IOHEXOL 300 MG/ML  SOLN
100.0000 mL | Freq: Once | INTRAMUSCULAR | Status: AC | PRN
  Administered 2019-06-20: 100 mL via INTRAVENOUS

## 2019-06-20 MED ORDER — FENTANYL CITRATE (PF) 100 MCG/2ML IJ SOLN
50.0000 ug | Freq: Once | INTRAMUSCULAR | Status: AC
  Administered 2019-06-20: 14:00:00 50 ug via INTRAVENOUS
  Filled 2019-06-20: qty 2

## 2019-06-20 MED ORDER — ENOXAPARIN SODIUM 40 MG/0.4ML ~~LOC~~ SOLN
40.0000 mg | SUBCUTANEOUS | Status: DC
  Administered 2019-06-21: 40 mg via SUBCUTANEOUS
  Filled 2019-06-20 (×4): qty 0.4

## 2019-06-20 NOTE — ED Provider Notes (Signed)
[facility] EMERGENCY DEPARTMENT Provider Note   CSN: [id number] Arrival date & time: 06/20/19  0272    History   Chief Complaint Chief Complaint  Patient presents with   Abdominal Pain    HPI Teresa Mcknight is a 73 y.o. female.     Patient is a 73 year old female who presents with abdominal pain.  She states she has had a history of abdominal pain for the last 3 days.  She describes it as a crampy pain under her diaphragm.  She also hurts in the lower part of her abdomen.  It got worse last night and through today.  She has had multiple episodes of nonbilious, nonbloody emesis.  She denies any fevers.  She had some constipation earlier but took a laxative yesterday and had a good bowel movement yesterday.  She denies any urinary symptoms.  She denies any prior abdominal surgeries.  She was seen in urgent care and sent here for further evaluation.  She did have labs done in urgent care which shows a mildly elevated lipase but otherwise on remarkable.     Past Medical History:  Diagnosis Date   Heart murmur    Palpitations 11/21/2018    Patient Active Problem List   Diagnosis Date Noted   Palpitations 11/21/2018   Neutropenia ([place]) 11/19/2018   Recurrent genital herpes 11/28/2016    History reviewed. No pertinent surgical history.   OB History   No obstetric history on file.      Home Medications    Prior to Admission medications   Medication Sig Start Date End Date Taking? Authorizing Provider  acyclovir (ZOVIRAX) 400 MG tablet Take 1 tablet (400 mg total) by mouth 3 (three) times daily. Prn for flare ups,start < 48 hrs after sx onset and for 5 days. Patient taking differently: Take 200 mg by mouth daily. Prn for flare ups,start < 48 hrs after sx onset and for 5 days. 11/28/16  Yes [place], Betty G, MD  Ascorbic Acid (VITAMIN C PO) Take 500 mg by mouth at bedtime.    Yes [provider]  VITAMIN B1-B12 PO Take 5,000 mg by mouth at  bedtime.    Yes [provider]  Cholecalciferol (VITAMIN D3) 50 MCG (2000 UT) TABS Take 2,000 Units by mouth at bedtime.    [provider]    Family History Family History  Problem Relation Age of Onset   Dementia Mother    Stroke Mother    Stroke Father    Multiple myeloma Sister    Multiple myeloma Brother    Dementia Maternal Grandmother    Hypothyroidism Brother    Hyperthyroidism Sister    Cancer Neg Hx    Diabetes Neg Hx     Social History Social History   Tobacco Use   Smoking status: Never Smoker   Smokeless tobacco: Never Used  Substance Use Topics   Alcohol use: No   Drug use: No     Allergies   Aspirin, Dairy aid [lactase], Latex, and Valtrex [valacyclovir hcl]   Review of Systems Review of Systems  Constitutional: Negative for chills, diaphoresis, fatigue and fever.  HENT: Negative for congestion, rhinorrhea and sneezing.   Eyes: Negative.   Respiratory: Negative for cough, chest tightness and shortness of breath.   Cardiovascular: Negative for chest pain and leg swelling.  Gastrointestinal: Positive for abdominal pain, nausea and vomiting. Negative for blood in stool and diarrhea.  Genitourinary: Negative for difficulty urinating, flank pain, frequency and hematuria.  Musculoskeletal: Negative for arthralgias and back pain.  Skin: Negative for rash.  Neurological: Negative for dizziness, speech difficulty, weakness, numbness and headaches.     Physical Exam Updated Vital Signs BP 130/82    Pulse 99    Temp 99.2 F (37.3 C) (Oral)    Resp 14    SpO2 100%   Physical Exam Constitutional:      Appearance: She is well-developed.  HENT:     Head: Normocephalic and atraumatic.  Eyes:     Pupils: Pupils are equal, round, and reactive to light.  Neck:     Musculoskeletal: Normal range of motion and neck supple.  Cardiovascular:     Rate and Rhythm: Normal rate and regular rhythm.     Heart sounds: Normal heart  sounds.  Pulmonary:     Effort: Pulmonary effort is normal. No respiratory distress.     Breath sounds: Normal breath sounds. No wheezing or rales.  Chest:     Chest wall: No tenderness.  Abdominal:     General: Bowel sounds are normal.     Palpations: Abdomen is soft.     Tenderness: There is abdominal tenderness in the epigastric area. There is no guarding or rebound.  Musculoskeletal: Normal range of motion.  Lymphadenopathy:     Cervical: No cervical adenopathy.  Skin:    General: Skin is warm and dry.     Findings: No rash.  Neurological:     Mental Status: She is alert and oriented to person, place, and time.      ED Treatments / Results  Labs (all labs ordered are listed, but only abnormal results are displayed) Labs Reviewed  LIPASE, BLOOD - Abnormal; Notable for the following components:      Result Value   Lipase 275 (*)    All other components within normal limits  COMPREHENSIVE METABOLIC PANEL - Abnormal; Notable for the following components:   Potassium 3.4 (*)    Glucose, Bld 141 (*)    Calcium 7.9 (*)    Total Protein 6.4 (*)    Albumin 3.2 (*)    All other components within normal limits  CBC - Abnormal; Notable for the following components:   Platelets 145 (*)    All other components within normal limits  URINALYSIS, ROUTINE W REFLEX MICROSCOPIC - Abnormal; Notable for the following components:   Glucose, UA 50 (*)    Ketones, ur 80 (*)    Protein, ur 100 (*)    All other components within normal limits  SARS CORONAVIRUS 2    EKG EKG Interpretation  Date/Time:  Thursday June 20 2019 09:48:12 EDT Ventricular Rate:  65 PR Interval:  130 QRS Duration: 82 QT Interval:  444 QTC Calculation: 461 R Axis:   76 Text Interpretation:  Normal sinus rhythm Normal ECG Confirmed by Malvin Johns [phone]) on 06/20/2019 11:29:15 AM   Radiology Ct Abdomen Pelvis W Contrast  Result Date: 06/20/2019 CLINICAL DATA:  Upper abdominal pain, bloating, distention  EXAM: CT ABDOMEN AND PELVIS WITH CONTRAST TECHNIQUE: Multidetector CT imaging of the abdomen and pelvis was performed using the standard protocol following bolus administration of intravenous contrast. CONTRAST:  137m OMNIPAQUE IOHEXOL 300 MG/ML  SOLN COMPARISON:  None. FINDINGS: Lower chest: No acute abnormality. Hepatobiliary: No focal liver abnormality is seen. No gallstones, gallbladder wall thickening, or biliary dilatation. Pancreas: Pancreas is diffusely enlarged and edematous with extensive peripancreatic stranding and fluid. Areas of heterogeneous enhancement within the body/tail suggesting early necrosis. More focal fluid  collection along the inferior aspect of the pancreatic body and tail with thin incomplete rim enhancement (series 3, images 32-36). Spleen: Normal in size without focal abnormality. Adrenals/Urinary Tract: Adrenal glands are unremarkable. Kidneys are normal, without renal calculi, focal lesion, or hydronephrosis. Bladder is unremarkable. Stomach/Bowel: Diffuse submucosal edema of the gastric wall, likely reactive secondary to adjacent inflammation within the pancreas. There is also wall thickening throughout the duodenum. No dilated loops of bowel. Vascular/Lymphatic: No significant vascular findings are present. No enlarged abdominal or pelvic lymph nodes. Reproductive: Enlarged uterus with multiple heterogeneous, partially calcified fibroids. Other: Small volume ascites.  No abdominal wall hernia. Musculoskeletal: No acute or significant osseous findings. IMPRESSION: 1. Acute interstitial edematous pancreatitis. Areas of heterogeneous enhancement within the pancreatic body and tail suggestive of early necrosis. Moderate amount of peripancreatic fluid without well-defined capsule or rim enhancement. 2. Thickening and edema of the gastric and duodenal walls, likely reactive secondary to adjacent inflammation. These results were called by telephone at the time of interpretation on  06/20/2019 at 1:41 pm to Dr. Threasa Beards Daneisha Surges , who verbally acknowledged these results. Electronically Signed   By: Davina Poke M.D.   On: 06/20/2019 13:43    Procedures Procedures (including critical care time)  Medications Ordered in ED Medications  sodium chloride flush (NS) 0.9 % injection 3 mL (has no administration in time range)  fentaNYL (SUBLIMAZE) injection 50 mcg (has no administration in time range)  fentaNYL (SUBLIMAZE) injection 50 mcg (50 mcg Intravenous Given 06/20/19 1228)  ondansetron (ZOFRAN) injection 4 mg (4 mg Intravenous Given 06/20/19 1227)  sodium chloride 0.9 % bolus 500 mL (0 mLs Intravenous Stopped 06/20/19 1357)  iohexol (OMNIPAQUE) 300 MG/ML solution 100 mL (100 mLs Intravenous Contrast Given 06/20/19 1309)     Initial Impression / Assessment and Plan / ED Course  I have reviewed the triage vital signs and the nursing notes.  Pertinent labs & imaging results that were available during my care of the patient were reviewed by me and considered in my medical decision making (see chart for details).        Patient is a 73 year old female with no significant past medical history who has upper abdominal pain.  Her lipase is elevated at 275.  Her other LFTs are normal.  Her white count is normal.  CT scan shows evidence of acute pancreatitis with evidence of necrosis of the pancreatic tail and surrounding edema.  Her vital signs are stable.  She was given pain control medications.  I spoke with the hospitalist to admit the patient for further treatment.  Final Clinical Impressions(s) / ED Diagnoses   Final diagnoses:  Acute pancreatitis with uninfected necrosis, unspecified pancreatitis type    ED Discharge Orders    None       Malvin Johns, MD 06/20/19 1413

## 2019-06-20 NOTE — Assessment & Plan Note (Signed)
Mild neutropenia:  10/04/2018: WBC 3.4, ANC 1.4, platelet count 133, hemoglobin 12.1 11/19/2018: WBC 4, ANC 1.6, platelets 132, hemoglobin 13, few atypical lymphs ANA: Negative, C38 and folic acid were normal, TSH and thyroid function: Normal 06/27/2019:    if the ANC drops below 1.0, we will need to do a bone marrow biopsy. Return to clinic every 6 months with labs and follow-up

## 2019-06-20 NOTE — ED Triage Notes (Signed)
Pt reports abdominal pain and bloated for 3 days. Pt states that she vomited last night.

## 2019-06-20 NOTE — ED Notes (Signed)
Patient transported to Ultrasound 

## 2019-06-20 NOTE — ED Notes (Signed)
ED TO INPATIENT HANDOFF REPORT  ED Nurse Name and Phone #: [phone]  S Name/Age/Gender Gerda DissLoystean Santino 73 y.o. female Room/Bed: 041C/041C  Code Status   Code Status: Not on file  Home/SNF/Other Home Patient oriented to: self, place, time and situation Is this baseline? Yes   Triage Complete: Triage complete  Chief Complaint ABD PAIN  Triage Note Pt reports abdominal pain and bloated for 3 days. Pt states that she vomited last night.    Allergies Allergies  Allergen Reactions  . Aspirin Other (See Comments)    Sore stomach  . Dairy Aid [Lactase]     Causes Mucus  . Latex Itching  . Valtrex [Valacyclovir Hcl] Nausea And Vomiting    Level of Care/Admitting Diagnosis ED Disposition    ED Disposition Condition Comment   Admit  [facility] [[id number]]  Level of Care: Med-Surg [16]  Covid Evaluation: Asymptomatic Screening Protocol (No Symptoms)  Diagnosis: Acute pancreatitis [577.0.ICD-9-CM]  Admitting Physician: Chiquita LothELGERGAWY, DAWOOD S [[id number]]  Attending Physician: Randol KernELGERGAWY, DAWOOD S [[id number]]  Estimated length of stay: past midnight tomorrow  Certification:: I certify this patient will need inpatient services for at least 2 midnights  PT Class (Do Not Modify): Inpatient [101]  PT Acc Code (Do Not Modify): Private [1]       B Medical/Surgery History Past Medical History:  Diagnosis Date  . Heart murmur   . Palpitations 11/21/2018   History reviewed. No pertinent surgical history.   A IV Location/Drains/Wounds Patient Lines/Drains/Airways Status   Active Line/Drains/Airways    Name:   Placement date:   Placement time:   Site:   Days:   Peripheral IV 06/20/19 Right Antecubital   06/20/19    1226    Antecubital   less than 1          Intake/Output Last 24 hours  Intake/Output Summary (Last 24 hours) at 06/20/2019 1502 Last data filed at 06/20/2019 1357 Gross per 24 hour  Intake 500 ml  Output -  Net 500 ml     Labs/Imaging Results for orders placed or performed during the hospital encounter of 06/20/19 (from the past 48 hour(s))  Urinalysis, Routine w reflex microscopic     Status: Abnormal   Collection Time: 06/20/19  9:44 AM  Result Value Ref Range   Color, Urine YELLOW YELLOW   APPearance CLEAR CLEAR   Specific Gravity, Urine 1.027 1.005 - 1.030   pH 6.0 5.0 - 8.0   Glucose, UA 50 (A) NEGATIVE mg/dL   Hgb urine dipstick NEGATIVE NEGATIVE   Bilirubin Urine NEGATIVE NEGATIVE   Ketones, ur 80 (A) NEGATIVE mg/dL   Protein, ur 322100 (A) NEGATIVE mg/dL   Nitrite NEGATIVE NEGATIVE   Leukocytes,Ua NEGATIVE NEGATIVE   RBC / HPF 11-20 0 - 5 RBC/hpf   WBC, UA 0-5 0 - 5 WBC/hpf   Bacteria, UA NONE SEEN NONE SEEN   Squamous Epithelial / LPF 0-5 0 - 5   Mucus PRESENT     Comment: Performed at [facility], [address] [place], [place] [address]  Lipase, blood     Status: Abnormal   Collection Time: 06/20/19  9:47 AM  Result Value Ref Range   Lipase 275 (H) 11 - 51 U/L    Comment: Performed at [facility], [address] [place], [place] [address]  Comprehensive metabolic panel     Status: Abnormal   Collection Time: 06/20/19  9:47 AM  Result Value Ref Range  Sodium 137 135 - 145 mmol/L   Potassium 3.4 (L) 3.5 - 5.1 mmol/L   Chloride 102 98 - 111 mmol/L   CO2 23 22 - 32 mmol/L   Glucose, Bld 141 (H) 70 - 99 mg/dL   BUN 10 8 - 23 mg/dL   Creatinine, Ser 8.750.94 0.44 - 1.00 mg/dL   Calcium 7.9 (L) 8.9 - 10.3 mg/dL   Total Protein 6.4 (L) 6.5 - 8.1 g/dL   Albumin 3.2 (L) 3.5 - 5.0 g/dL   AST 37 15 - 41 U/L   ALT 20 0 - 44 U/L   Alkaline Phosphatase 82 38 - 126 U/L   Total Bilirubin 0.7 0.3 - 1.2 mg/dL   GFR calc non Af Amer >60 >60 mL/min   GFR calc Af Amer >60 >60 mL/min   Anion gap 12 5 - 15    Comment: Performed at [facility], [address] [place], [place] [address]  CBC     Status: Abnormal   Collection Time: 06/20/19  9:47 AM  Result Value Ref Range    WBC 8.5 4.0 - 10.5 K/uL   RBC 4.13 3.87 - 5.11 MIL/uL   Hemoglobin 13.0 12.0 - 15.0 g/dL   HCT 95.140.5 88.436.0 - 16.646.0 %   MCV 98.1 80.0 - 100.0 fL   MCH 31.5 26.0 - 34.0 pg   MCHC 32.1 30.0 - 36.0 g/dL   RDW 06.314.3 01.611.5 - 01.015.5 %   Platelets 145 (L) 150 - 400 K/uL    Comment: REPEATED TO VERIFY   nRBC 0.0 0.0 - 0.2 %    Comment: Performed at [facility], [address] [place], [place] [address]   Ct Abdomen Pelvis W Contrast  Result Date: 06/20/2019 CLINICAL DATA:  Upper abdominal pain, bloating, distention EXAM: CT ABDOMEN AND PELVIS WITH CONTRAST TECHNIQUE: Multidetector CT imaging of the abdomen and pelvis was performed using the standard protocol following bolus administration of intravenous contrast. CONTRAST:  100mL OMNIPAQUE IOHEXOL 300 MG/ML  SOLN COMPARISON:  None. FINDINGS: Lower chest: No acute abnormality. Hepatobiliary: No focal liver abnormality is seen. No gallstones, gallbladder wall thickening, or biliary dilatation. Pancreas: Pancreas is diffusely enlarged and edematous with extensive peripancreatic stranding and fluid. Areas of heterogeneous enhancement within the body/tail suggesting early necrosis. More focal fluid collection along the inferior aspect of the pancreatic body and tail with thin incomplete rim enhancement (series 3, images 32-36). Spleen: Normal in size without focal abnormality. Adrenals/Urinary Tract: Adrenal glands are unremarkable. Kidneys are normal, without renal calculi, focal lesion, or hydronephrosis. Bladder is unremarkable. Stomach/Bowel: Diffuse submucosal edema of the gastric wall, likely reactive secondary to adjacent inflammation within the pancreas. There is also wall thickening throughout the duodenum. No dilated loops of bowel. Vascular/Lymphatic: No significant vascular findings are present. No enlarged abdominal or pelvic lymph nodes. Reproductive: Enlarged uterus with multiple heterogeneous, partially calcified fibroids. Other: Small volume  ascites.  No abdominal wall hernia. Musculoskeletal: No acute or significant osseous findings. IMPRESSION: 1. Acute interstitial edematous pancreatitis. Areas of heterogeneous enhancement within the pancreatic body and tail suggestive of early necrosis. Moderate amount of peripancreatic fluid without well-defined capsule or rim enhancement. 2. Thickening and edema of the gastric and duodenal walls, likely reactive secondary to adjacent inflammation. These results were called by telephone at the time of interpretation on 06/20/2019 at 1:41 pm to Dr. Shawna OrleansMELANIE BELFI , who verbally acknowledged these results. Electronically Signed   By: Duanne GuessNicholas  Plundo M.D.   On: 06/20/2019 13:43  Pending Labs Unresulted Labs (From admission, onward)    Start     Ordered   06/20/19 1441  Triglycerides  Once,   STAT     06/20/19 1441   06/20/19 1348  SARS CORONAVIRUS 2 Nasal Swab Aptima Multi Swab  (Asymptomatic/Tier 2 Patients Labs)  Once,   STAT    Question Answer Comment  Is this test for diagnosis or screening Screening   Symptomatic for COVID-19 as defined by CDC No   Hospitalized for COVID-19 No   Admitted to ICU for COVID-19 No   Previously tested for COVID-19 No   Resident in a congregate (group) care setting No   Employed in healthcare setting No   Pregnant No      06/20/19 1347   Signed and Held  CBC  (enoxaparin (LOVENOX)    CrCl >/= 30 ml/min)  Once,   R    Comments: Baseline for enoxaparin therapy IF NOT ALREADY DRAWN.  Notify MD if PLT < 100 K.    Signed and Held   Signed and Held  Creatinine, serum  (enoxaparin (LOVENOX)    CrCl >/= 30 ml/min)  Once,   R    Comments: Baseline for enoxaparin therapy IF NOT ALREADY DRAWN.    Signed and Held   Signed and Held  Creatinine, serum  (enoxaparin (LOVENOX)    CrCl >/= 30 ml/min)  Weekly,   R    Comments: while on enoxaparin therapy    Signed and Held   Signed and Held  Comprehensive metabolic panel  Tomorrow morning,   R     Signed and Held    Signed and Held  CBC  Tomorrow morning,   R     Signed and Held          Vitals/Pain Today's Vitals   06/20/19 1300 06/20/19 1350 06/20/19 1352 06/20/19 1400  BP: 138/65   130/82  Pulse: 62 63 99   Resp: (!) 31 18 19 14   Temp:      TempSrc:      SpO2: 100% 100% 100%   PainSc:        Isolation Precautions No active isolations  Medications Medications  sodium chloride flush (NS) 0.9 % injection 3 mL (has no administration in time range)  0.9 %  sodium chloride infusion (has no administration in time range)  morphine 2 MG/ML injection 2 mg (has no administration in time range)  potassium chloride SA (K-DUR) CR tablet 40 mEq (has no administration in time range)  fentaNYL (SUBLIMAZE) injection 50 mcg (50 mcg Intravenous Given 06/20/19 1228)  ondansetron (ZOFRAN) injection 4 mg (4 mg Intravenous Given 06/20/19 1227)  sodium chloride 0.9 % bolus 500 mL (0 mLs Intravenous Stopped 06/20/19 1357)  iohexol (OMNIPAQUE) 300 MG/ML solution 100 mL (100 mLs Intravenous Contrast Given 06/20/19 1309)  fentaNYL (SUBLIMAZE) injection 50 mcg (50 mcg Intravenous Given 06/20/19 1418)    Mobility walks Low fall risk   Focused Assessments GI   R Recommendations: See Admitting Provider Note  Report given to:   Additional Notes: .

## 2019-06-20 NOTE — ED Triage Notes (Signed)
Pt states she has abdominal pain and vomiting  X 3 days. Pt states the vomiting started last night.

## 2019-06-20 NOTE — H&P (Addendum)
TRH H&P   Patient Demographics:    Teresa Mcknight, is a 73 y.o. female  MRN: [id number]   DOB - [birth year]  Admit Date - 06/20/2019  Outpatient Primary MD for the patient is Haygood, Seymour Bars, MD  Referring MD/NP/PA: Dr Tamera Punt  Outpatient Specialists: GI Dr Beather Arbour  Patient coming from: home  Chief Complaint  Patient presents with  . Abdominal Pain      HPI:    Teresa Mcknight  is a 73 y.o. female, without significant past medical history, she presents to urgent care today secondary to complaints of epigastric abdominal pain, bloating, distention, over last 3 days, and nausea and vomiting over last 24 hours, worse abdominal pain is progressive, sharp, epigastric, continuous, she started to vomit yesterday, bilious vomiting, x5, ports overall she is constipated, requiring laxatives, denies fever, chills, coffee-ground emesis or diarrhea. - in ED lipase were elevated at 275, potassium low at 3.4, ED abdomen and pelvis significant for otitis with early necrosis, denies any history of alcohol use, no history of gallstones, I was called to admit.    Review of systems:    In addition to the HPI above,  No Fever-chills, No Headache, No changes with Vision or hearing, No problems swallowing food or Liquids, No Chest pain, Cough or Shortness of Breath, Complains of  Abdominal pain, Nausea and Vommitting, reports constipation. No Blood in stool or Urine, No dysuria, No new skin rashes or bruises, No new joints pains-aches,  No new weakness, tingling, numbness in any extremity, No recent weight gain or loss, No polyuria, polydypsia or polyphagia, No significant Mental Stressors.  A full 10 point Review of Systems was done, except as stated above, all other Review of Systems were negative.   With Past History of the following :    Past Medical History:  Diagnosis Date  . Heart  murmur   . Palpitations 11/21/2018      History reviewed. No pertinent surgical history.    Social History:     Social History   Tobacco Use  . Smoking status: Never Smoker  . Smokeless tobacco: Never Used  Substance Use Topics  . Alcohol use: No      Family History :     Family History  Problem Relation Age of Onset  . Dementia Mother   . Stroke Mother   . Stroke Father   . Multiple myeloma Sister   . Multiple myeloma Brother   . Dementia Maternal Grandmother   . Hypothyroidism Brother   . Hyperthyroidism Sister   . Cancer Neg Hx   . Diabetes Neg Hx       Home Medications:   Prior to Admission medications   Medication Sig Start Date End Date Taking? Authorizing Provider  acyclovir (ZOVIRAX) 400 MG tablet Take 1 tablet (400 mg total) by mouth 3 (three) times daily. Prn for flare ups,start < 48 hrs  after sx onset and for 5 days. Patient taking differently: Take 200 mg by mouth daily. Prn for flare ups,start < 48 hrs after sx onset and for 5 days. 11/28/16  Yes Martinique, Betty G, MD  Ascorbic Acid (VITAMIN C PO) Take 500 mg by mouth at bedtime.    Yes [provider]  VITAMIN B1-B12 PO Take 5,000 mg by mouth at bedtime.    Yes [provider]  Cholecalciferol (VITAMIN D3) 50 MCG (2000 UT) TABS Take 2,000 Units by mouth at bedtime.    [provider]     Allergies:     Allergies  Allergen Reactions  . Aspirin Other (See Comments)    Sore stomach  . Dairy Aid [Lactase]     Causes Mucus  . Latex Itching  . Valtrex [Valacyclovir Hcl] Nausea And Vomiting     Physical Exam:   Vitals  Blood pressure 130/82, pulse 99, temperature 99.2 F (37.3 C), temperature source Oral, resp. rate 14, SpO2 100 %.   1. General  Well developed lying in bed in NAD,  2. Normal affect and insight, Not Suicidal or Homicidal, Awake Alert, Oriented X 3.  3. No F.N deficits, ALL C.Nerves Intact, Strength 5/5 all 4 extremities, Sensation intact all 4  extremities, Plantars down going.  4. Ears and Eyes appear Normal, Conjunctivae clear, PERRLA. Moist Oral Mucosa.  5. Supple Neck, No JVD, No cervical lymphadenopathy appriciated, No Carotid Bruits.  6. Symmetrical Chest wall movement, Good air movement bilaterally, CTAB.  7. RRR, No Gallops, Rubs or Murmurs, No Parasternal Heave.  8. Positive Bowel Sounds, epigastic  tenderness, No organomegaly appriciated,No rebound -guarding or rigidity.  9.  No Cyanosis, Normal Skin Turgor, No Skin Rash or Bruise.  10. Good muscle tone,  joints appear normal , no effusions, Normal ROM.  11. No Palpable Lymph Nodes in Neck or Axillae   Data Review:    CBC Recent Labs  Lab 06/20/19 0947  WBC 8.5  HGB 13.0  HCT 40.5  PLT 145*  MCV 98.1  MCH 31.5  MCHC 32.1  RDW 14.3   ------------------------------------------------------------------------------------------------------------------  Chemistries  Recent Labs  Lab 06/20/19 0947  NA 137  K 3.4*  CL 102  CO2 23  GLUCOSE 141*  BUN 10  CREATININE 0.94  CALCIUM 7.9*  AST 37  ALT 20  ALKPHOS 82  BILITOT 0.7   ------------------------------------------------------------------------------------------------------------------ CrCl cannot be calculated (Unknown ideal weight.). ------------------------------------------------------------------------------------------------------------------ No results for input(s): TSH, T4TOTAL, T3FREE, THYROIDAB in the last 72 hours.  Invalid input(s): FREET3  Coagulation profile No results for input(s): INR, PROTIME in the last 168 hours. ------------------------------------------------------------------------------------------------------------------- No results for input(s): DDIMER in the last 72 hours. -------------------------------------------------------------------------------------------------------------------  Cardiac Enzymes No results for input(s): CKMB, TROPONINI, MYOGLOBIN in the  last 168 hours.  Invalid input(s): CK ------------------------------------------------------------------------------------------------------------------ No results found for: BNP   ---------------------------------------------------------------------------------------------------------------  Urinalysis    Component Value Date/Time   COLORURINE YELLOW 06/20/2019 [place] 06/20/2019 0944   LABSPEC 1.027 06/20/2019 0944   PHURINE 6.0 06/20/2019 0944   GLUCOSEU 50 (A) 06/20/2019 0944   HGBUR NEGATIVE 06/20/2019 0944   BILIRUBINUR NEGATIVE 06/20/2019 0944   KETONESUR 80 (A) 06/20/2019 0944   PROTEINUR 100 (A) 06/20/2019 0944   NITRITE NEGATIVE 06/20/2019 0944   LEUKOCYTESUR NEGATIVE 06/20/2019 0944    ----------------------------------------------------------------------------------------------------------------   Imaging Results:    Ct Abdomen Pelvis W Contrast  Result Date: 06/20/2019 CLINICAL DATA:  Upper abdominal pain, bloating, distention EXAM: CT ABDOMEN AND  PELVIS WITH CONTRAST TECHNIQUE: Multidetector CT imaging of the abdomen and pelvis was performed using the standard protocol following bolus administration of intravenous contrast. CONTRAST:  192m OMNIPAQUE IOHEXOL 300 MG/ML  SOLN COMPARISON:  None. FINDINGS: Lower chest: No acute abnormality. Hepatobiliary: No focal liver abnormality is seen. No gallstones, gallbladder wall thickening, or biliary dilatation. Pancreas: Pancreas is diffusely enlarged and edematous with extensive peripancreatic stranding and fluid. Areas of heterogeneous enhancement within the body/tail suggesting early necrosis. More focal fluid collection along the inferior aspect of the pancreatic body and tail with thin incomplete rim enhancement (series 3, images 32-36). Spleen: Normal in size without focal abnormality. Adrenals/Urinary Tract: Adrenal glands are unremarkable. Kidneys are normal, without renal calculi, focal lesion, or  hydronephrosis. Bladder is unremarkable. Stomach/Bowel: Diffuse submucosal edema of the gastric wall, likely reactive secondary to adjacent inflammation within the pancreas. There is also wall thickening throughout the duodenum. No dilated loops of bowel. Vascular/Lymphatic: No significant vascular findings are present. No enlarged abdominal or pelvic lymph nodes. Reproductive: Enlarged uterus with multiple heterogeneous, partially calcified fibroids. Other: Small volume ascites.  No abdominal wall hernia. Musculoskeletal: No acute or significant osseous findings. IMPRESSION: 1. Acute interstitial edematous pancreatitis. Areas of heterogeneous enhancement within the pancreatic body and tail suggestive of early necrosis. Moderate amount of peripancreatic fluid without well-defined capsule or rim enhancement. 2. Thickening and edema of the gastric and duodenal walls, likely reactive secondary to adjacent inflammation. These results were called by telephone at the time of interpretation on 06/20/2019 at 1:41 pm to Dr. MThreasa BeardsBELFI , who verbally acknowledged these results. Electronically Signed   By: NDavina PokeM.D.   On: 06/20/2019 13:43    My personal review of EKG: Rhythm NSR, Rate  65 /min, QTc 461 , no Acute ST changes   Assessment & Plan:    Active Problems:   Acute pancreatitis  Acute pancreatitis -Patient presents with abdominal pain, nausea, vomiting for last 3 days, the abdomen and pelvis showing evidence early necrosis in the pancreatic body and tail, with peripancreatic fluid, surrounding gastric and duodenal wall edema, most likely inflammation from pancreatitis. -Lipase elevated at 279, repeat in a.m. -LFTs within normal limit, will obtain right upper quadrant ultrasound to rule out any gallstones -No history of alcohol use -Keep n.p.o., will keep on IV fluids, will keep on PRN nausea and pain medications. -We will check triglyceride level -Discussed with GI Dr. GPenelope Coop further  recommendation, but she needs to follow with Dr. EOletta Lamasfrom [facility] ensure there is no malignancy after her inflammation subsides.  Hypokalemia -Repleted   DVT Prophylaxis Lovenox-  SCDs  AM Labs Ordered, also please review Full Orders  Family Communication: Admission, patients condition and plan of care including tests being ordered have been discussed with the patient  who indicate understanding and agree with the plan and Code Status.  Code Status Full  Likely DC to  Home  Condition GUARDED    Consults called: D/W GI Dr. GPenelope Coopvia phone  Admission status: inpatient  Time spent in minutes : 50 minutes   DPhillips ClimesM.D on 06/20/2019 at 2:30 PM  Between 7am to 7pm - Pager - [phone] After 7pm go to [email or web address] - password [facility] - Office  [phone]

## 2019-06-20 NOTE — ED Notes (Signed)
Pt. Transported to CT 

## 2019-06-20 NOTE — ED Provider Notes (Signed)
[place]    CSN: [id number] Arrival date & time: 06/20/19  0815     History   Chief Complaint Chief Complaint  Patient presents with  . Abdominal Pain    HPI Teresa Mcknight is a 73 y.o. female.   Patient is a 73 year old female with no significant past medical history.  She presents today with upper abdominal discomfort, bloating and distention.  This is been present and worsening over the past 3 days.  She started vomiting last night.  Vomited 5 times.  Reported vomiting clear and burning liquid.  Last bowel movement was yesterday.  She took a laxative Tuesday due to constipation.  Seem to have a pretty good bowel movement yesterday.  Denies any hematemesis or hematochezia.  Denies any diarrhea or fevers.  Reporting some sweats and chills last night. ROS per HPI      Past Medical History:  Diagnosis Date  . Heart murmur   . Palpitations 11/21/2018    Patient Active Problem List   Diagnosis Date Noted  . Palpitations 11/21/2018  . Neutropenia ([place]) 11/19/2018  . Recurrent genital herpes 11/28/2016    History reviewed. No pertinent surgical history.  OB History   No obstetric history on file.      Home Medications    Prior to Admission medications   Medication Sig Start Date End Date Taking? Authorizing Provider  acyclovir (ZOVIRAX) 400 MG tablet Take 1 tablet (400 mg total) by mouth 3 (three) times daily. Prn for flare ups,start < 48 hrs after sx onset and for 5 days. 11/28/16   [place], Betty G, MD  Ascorbic Acid (VITAMIN C PO) Take by mouth.    [provider]  VITAMIN B1-B12 PO Take by mouth.    [provider]    Family History Family History  Problem Relation Age of Onset  . Dementia Mother   . Stroke Mother   . Stroke Father   . Multiple myeloma Sister   . Multiple myeloma Brother   . Dementia Maternal Grandmother   . Hypothyroidism Brother   . Hyperthyroidism Sister   . Cancer Neg Hx   . Diabetes Neg Hx      Social History Social History   Tobacco Use  . Smoking status: Never Smoker  . Smokeless tobacco: Never Used  Substance Use Topics  . Alcohol use: No  . Drug use: No     Allergies   Patient has no known allergies.   Review of Systems Review of Systems   Physical Exam Triage Vital Signs ED Triage Vitals  Enc Vitals Group     BP 06/20/19 0859 122/66     Pulse Rate 06/20/19 0859 65     Resp 06/20/19 0859 16     Temp 06/20/19 0859 98.4 F (36.9 C)     Temp Source 06/20/19 0859 Oral     SpO2 06/20/19 0859 100 %     Weight 06/20/19 0857 110 lb (49.9 kg)     Height --      Head Circumference --      Peak Flow --      Pain Score 06/20/19 0857 10     Pain Loc --      Pain Edu? --      Excl. in [place]? --    No data found.  Updated Vital Signs BP 122/66 (BP Location: Right Arm)   Pulse 65   Temp 98.4 F (36.9 C) (Oral)   Resp 16   Wt  110 lb (49.9 kg)   SpO2 100%   BMI 18.30 kg/m   Visual Acuity Right Eye Distance:   Left Eye Distance:   Bilateral Distance:    Right Eye Near:   Left Eye Near:    Bilateral Near:     Physical Exam Vitals signs and nursing note reviewed.  Constitutional:      General: She is not in acute distress.    Appearance: She is well-developed. She is ill-appearing. She is not toxic-appearing.  HENT:     Head: Normocephalic and atraumatic.  Cardiovascular:     Rate and Rhythm: Normal rate and regular rhythm.  Pulmonary:     Effort: Pulmonary effort is normal.  Abdominal:     General: Bowel sounds are decreased. There is distension.     Palpations: There is no hepatomegaly or splenomegaly.     Tenderness: There is abdominal tenderness in the right upper quadrant and epigastric area. There is no right CVA tenderness.  Skin:    General: Skin is warm and dry.  Neurological:     General: No focal deficit present.     Mental Status: She is alert.  Psychiatric:        Mood and Affect: Mood normal.      UC Treatments / Results   Labs (all labs ordered are listed, but only abnormal results are displayed) Labs Reviewed - No data to display  EKG   Radiology No results found.  Procedures Procedures (including critical care time)  Medications Ordered in UC Medications - No data to display  Initial Impression / Assessment and Plan / UC Course  I have reviewed the triage vital signs and the nursing notes.  Pertinent labs & imaging results that were available during my care of the patient were reviewed by me and considered in my medical decision making (see chart for details).     Patient is a 87-year female presents today with upper abdominal discomfort.  She has very distended abdomen and tender to palpation.  Patient is ill-appearing.  Vomited 5 times since last night. Concern for bowel obstruction or some other  etiology.  Patient need CT scan. Sending to the ER for further evaluation and management. Vital signs are stable here in clinic Patient being pushed down in a wheelchair Final Clinical Impressions(s) / UC Diagnoses   Final diagnoses:  Epigastric pain     Discharge Instructions     Please go to the ER for further evaluation.     ED Prescriptions    None     Controlled Substance Prescriptions [place] Controlled Substance Registry consulted? Not Applicable   Orvan July, NP 06/20/19 [phone]

## 2019-06-20 NOTE — Discharge Instructions (Addendum)
Please go to the ER for further evaluation °

## 2019-06-20 NOTE — ED Notes (Signed)
Patient transported to CT 

## 2019-06-20 NOTE — ED Notes (Signed)
Admitting Provider at bedside. 

## 2019-06-21 DIAGNOSIS — K8501 Idiopathic acute pancreatitis with uninfected necrosis: Principal | ICD-10-CM

## 2019-06-21 LAB — CBC
HCT: 38.2 % (ref 36.0–46.0)
Hemoglobin: 12.6 g/dL (ref 12.0–15.0)
MCH: 31.7 pg (ref 26.0–34.0)
MCHC: 33 g/dL (ref 30.0–36.0)
MCV: 96 fL (ref 80.0–100.0)
Platelets: 144 10*3/uL — ABNORMAL LOW (ref 150–400)
RBC: 3.98 MIL/uL (ref 3.87–5.11)
RDW: 14.4 % (ref 11.5–15.5)
WBC: 8.3 10*3/uL (ref 4.0–10.5)
nRBC: 0 % (ref 0.0–0.2)

## 2019-06-21 LAB — COMPREHENSIVE METABOLIC PANEL
ALT: 17 U/L (ref 0–44)
AST: 34 U/L (ref 15–41)
Albumin: 2.5 g/dL — ABNORMAL LOW (ref 3.5–5.0)
Alkaline Phosphatase: 53 U/L (ref 38–126)
Anion gap: 11 (ref 5–15)
BUN: 16 mg/dL (ref 8–23)
CO2: 19 mmol/L — ABNORMAL LOW (ref 22–32)
Calcium: 7.4 mg/dL — ABNORMAL LOW (ref 8.9–10.3)
Chloride: 106 mmol/L (ref 98–111)
Creatinine, Ser: 1.03 mg/dL — ABNORMAL HIGH (ref 0.44–1.00)
GFR calc Af Amer: >60 mL/min (ref >60)
GFR calc non Af Amer: 55 mL/min — ABNORMAL LOW (ref >60)
Glucose, Bld: 95 mg/dL (ref 70–99)
Potassium: 4.2 mmol/L (ref 3.5–5.1)
Sodium: 136 mmol/L (ref 135–145)
Total Bilirubin: 0.9 mg/dL (ref 0.3–1.2)
Total Protein: 5.2 g/dL — ABNORMAL LOW (ref 6.5–8.1)

## 2019-06-21 MED ORDER — SIMETHICONE 80 MG PO CHEW
80.0000 mg | CHEWABLE_TABLET | Freq: Four times a day (QID) | ORAL | Status: DC | PRN
  Administered 2019-06-21 – 2019-06-23 (×5): 80 mg via ORAL
  Filled 2019-06-21 (×6): qty 1

## 2019-06-21 NOTE — Progress Notes (Signed)
PROGRESS NOTE    [name]  ZOX:[id number] DOB: 06/08/1946 DOA: 06/20/2019 PCP: Hal Morales, MD    Brief Narrative:   Teresa Mcknight  is a 73 y.o. female, without significant past medical history, she presents to urgent care today secondary to complaints of epigastric abdominal pain, bloating, distention, over last 3 days, and nausea and vomiting over last 24 hours, worse abdominal pain is progressive, sharp, epigastric, continuous, she started to vomit yesterday, bilious vomiting, x5, reports constipated, requiring laxatives, denies fever, chills, coffee-ground emesis or diarrhea.  In ED, lipase was elevated at 275, potassium low at 3.4, ED abdomen and pelvis significant for pancreatitis with early necrosis, denies any history of alcohol use, no history of gallstones, I was called to admit.   Assessment & Plan:   Active Problems:   Acute pancreatitis  Acute idiopathic pancreatitis with necrosis Patient presenting with epigastric abdominal pain that is been progressing over the past 24 hours associated with bilious nausea and vomiting.  Lipase noted to be elevated at 275 on admission with CT abdomen/pelvis significant for extensive peripancreatic stranding and fluid and enhancement within the body/tail suggestive of early necrosis.  No gallstones or gallbladder wall thickening noted or biliary dilation.  Lipase within normal limits and no history of alcohol abuse. --N.p.o. with ice chips/sips of water with medications --NS at 150 mL's per hour --Patient declines narcotics for pain control and wishes to utilize Tylenol --Follow lipase daily --Supportive care --Case was discussed with [facility] on call, Dr. Evette Cristal, by admitting hospitalist and no other recommendations at this time.  Will need follow-up with her primary gastroenterologist, Dr. Randa Evens following discharge to ensure there is no malignancy once inflammation subsides.  Hypokalemia Repleted. --Repeat electrolytes in  a.m. to include magnesium.   DVT prophylaxis: Lovenox / SCDs Code Status: Full code Family Communication: None Disposition Plan: Continue inpatient hospitalization, IV fluid hydration, antiemetics, pain control   Consultants:   None  Procedures:   None  Antimicrobials:   None   Subjective: Patient seen and examined at bedside, resting comfortably in bedside chair.  Continues with epigastric pain with radiation towards the back.  Continues with poor appetite and nausea.  Continues on IV fluids for hydration.  Patient states does not want to utilize any narcotics due to side effects and wants to utilize Tylenol for pain control.  No other complaints or concerns at this time.  Denies headache, no fever/chills/night sweats, no vomiting/diarrhea, no chest pain, no palpitations, no shortness of breath, no weakness, no issues with bowel/bladder function, no paresthesias.  No acute events overnight per nurse staff.  Objective: Vitals:   06/20/19 1644 06/20/19 1955 06/21/19 0348 06/21/19 0942  BP: 124/78 112/63 95/63 100/60  Pulse: 87 (!) 104 (!) 111 96  Resp: 18 18 18    Temp: 98.5 F (36.9 C) 100 F (37.8 C) 99 F (37.2 C) 99.1 F (37.3 C)  TempSrc: Oral Oral Oral Oral  SpO2: 100% 100% 99% 100%    Intake/Output Summary (Last 24 hours) at 06/21/2019 1323 Last data filed at 06/20/2019 1357 Gross per 24 hour  Intake 500 ml  Output -  Net 500 ml   There were no vitals filed for this visit.  Examination:  General exam: Appears calm and comfortable  Respiratory system: Clear to auscultation. Respiratory effort normal. Cardiovascular system: S1 & S2 heard, RRR. No JVD, murmurs, rubs, gallops or clicks. No pedal edema. Gastrointestinal system: Abdomen is nondistended, mild epigastric tenderness, soft. No organomegaly or masses felt.  Normal bowel sounds heard. Central nervous system: Alert and oriented. No focal neurological deficits. Extremities: Symmetric 5 x 5 power. Skin: No  rashes, lesions or ulcers Psychiatry: Judgement and insight appear normal. Mood & affect appropriate.     Data Reviewed: I have personally reviewed following labs and imaging studies  CBC: Recent Labs  Lab 06/20/19 0947 06/21/19 0223  WBC 8.5 8.3  HGB 13.0 12.6  HCT 40.5 38.2  MCV 98.1 96.0  PLT 145* 144*   Basic Metabolic Panel: Recent Labs  Lab 06/20/19 0947 06/21/19 0223  NA 137 136  K 3.4* 4.2  CL 102 106  CO2 23 19*  GLUCOSE 141* 95  BUN 10 16  CREATININE 0.94 1.03*  CALCIUM 7.9* 7.4*   GFR: CrCl cannot be calculated (Unknown ideal weight.). Liver Function Tests: Recent Labs  Lab 06/20/19 0947 06/21/19 0223  AST 37 34  ALT 20 17  ALKPHOS 82 53  BILITOT 0.7 0.9  PROT 6.4* 5.2*  ALBUMIN 3.2* 2.5*   Recent Labs  Lab 06/20/19 0947  LIPASE 275*   No results for input(s): AMMONIA in the last 168 hours. Coagulation Profile: No results for input(s): INR, PROTIME in the last 168 hours. Cardiac Enzymes: No results for input(s): CKTOTAL, CKMB, CKMBINDEX, TROPONINI in the last 168 hours. BNP (last 3 results) No results for input(s): PROBNP in the last 8760 hours. HbA1C: No results for input(s): HGBA1C in the last 72 hours. CBG: No results for input(s): GLUCAP in the last 168 hours. Lipid Profile: Recent Labs    06/20/19 1658  TRIG 49   Thyroid Function Tests: No results for input(s): TSH, T4TOTAL, FREET4, T3FREE, THYROIDAB in the last 72 hours. Anemia Panel: No results for input(s): VITAMINB12, FOLATE, FERRITIN, TIBC, IRON, RETICCTPCT in the last 72 hours. Sepsis Labs: No results for input(s): PROCALCITON, LATICACIDVEN in the last 168 hours.  Recent Results (from the past 240 hour(s))  SARS CORONAVIRUS 2 Nasal Swab Aptima Multi Swab     Status: None   Collection Time: 06/20/19  1:57 PM   Specimen: Aptima Multi Swab; Nasal Swab  Result Value Ref Range Status   SARS Coronavirus 2 NEGATIVE NEGATIVE Final    Comment: (NOTE) SARS-CoV-2 target  nucleic acids are NOT DETECTED. The SARS-CoV-2 RNA is generally detectable in upper and lower respiratory specimens during the acute phase of infection. Negative results do not preclude SARS-CoV-2 infection, do not rule out co-infections with other pathogens, and should not be used as the sole basis for treatment or other patient management decisions. Negative results must be combined with clinical observations, patient history, and epidemiological information. The expected result is Negative. Fact Sheet for Patients: HairSlick.no Fact Sheet for Healthcare Providers: quierodirigir.com This test is not yet approved or cleared by the [place] FDA and  has been authorized for detection and/or diagnosis of SARS-CoV-2 by FDA under an Emergency Use Authorization (EUA). This EUA will remain  in effect (meaning this test can be used) for the duration of the COVID-19 declaration under Section 56 4(b)(1) of the Act, 21 U.S.C. section 360bbb-3(b)(1), unless the authorization is terminated or revoked sooner. Performed at [facility], [address] [place], [place] [address]          Radiology Studies: Ct Abdomen Pelvis W Contrast  Result Date: 06/20/2019 CLINICAL DATA:  Upper abdominal pain, bloating, distention EXAM: CT ABDOMEN AND PELVIS WITH CONTRAST TECHNIQUE: Multidetector CT imaging of the abdomen and pelvis was performed using the standard protocol following bolus  administration of intravenous contrast. CONTRAST:  OMNIPAQUE IOHEXOL 300 MG/ML  SOLN COMPARISON:  None. FINDINGS: Lower chest: No acute abnormality. Hepatobiliary: No focal liver abnormality is seen. No gallstones, gallbladder wall thickening, or biliary dilatation. Pancreas: Pancreas is diffusely enlarged and edematous with extensive peripancreatic stranding and fluid. Areas of heterogeneous enhancement within the body/tail suggesting early necrosis. More  focal fluid collection along the inferior aspect of the pancreatic body and tail with thin incomplete rim enhancement (series 3, images 32-36). Spleen: Normal in size without focal abnormality. Adrenals/Urinary Tract: Adrenal glands are unremarkable. Kidneys are normal, without renal calculi, focal lesion, or hydronephrosis. Bladder is unremarkable. Stomach/Bowel: Diffuse submucosal edema of the gastric wall, likely reactive secondary to adjacent inflammation within the pancreas. There is also wall thickening throughout the duodenum. No dilated loops of bowel. Vascular/Lymphatic: No significant vascular findings are present. No enlarged abdominal or pelvic lymph nodes. Reproductive: Enlarged uterus with multiple heterogeneous, partially calcified fibroids. Other: Small volume ascites.  No abdominal wall hernia. Musculoskeletal: No acute or significant osseous findings. IMPRESSION: 1. Acute interstitial edematous pancreatitis. Areas of heterogeneous enhancement within the pancreatic body and tail suggestive of early necrosis. Moderate amount of peripancreatic fluid without well-defined capsule or rim enhancement. 2. Thickening and edema of the gastric and duodenal walls, likely reactive secondary to adjacent inflammation. These results were called by telephone at the time of interpretation on 06/20/2019 at 1:41 pm to Dr. Shawna Orleans BELFI , who verbally acknowledged these results. Electronically Signed   By: Duanne Guess M.D.   On: 06/20/2019 13:43   US Abdomen Limited Ruq  Result Date: 06/20/2019 CLINICAL DATA:  Epigastric pain.  Pancreatitis. EXAM: ULTRASOUND ABDOMEN LIMITED RIGHT UPPER QUADRANT COMPARISON:  CT scan dated 06/20/2019 FINDINGS: Gallbladder: No gallstones or wall thickening visualized. No sonographic Murphy sign noted by sonographer. Common bile duct: Obscured by bowel. The CT scan performed on this same date demonstrate a normal size of the common bile duct with no significant intrahepatic ductal  dilatation. Liver: No focal lesion identified. Within normal limits in parenchymal echogenicity. Portal vein is patent on color Doppler imaging with normal direction of blood flow towards the liver. Other: Small right pleural effusion. Fluid in the right perinephric space and in the peritoneal space adjacent to the right kidney. IMPRESSION: Normal appearing gallbladder.  No bile duct dilatation.  Ascites. Electronically Signed   By: Francene Boyers M.D.   On: 06/20/2019 16:30        Scheduled Meds: . enoxaparin (LOVENOX) injection  40 mg Subcutaneous Q24H  . senna  1 tablet Oral BID   Continuous Infusions: . sodium chloride 150 mL/hr at 06/21/19 1003     LOS: 1 day    Time spent: 37 minutes spent on chart review, personally reviewed all imaging studies and labs, discussion with nursing staff, consultants, updating family and interview/physical exam; more than 50% of that time was spent in counseling and/or coordination of care.    Alvira Philips [place], DO [facility] Pager [phone]  If 7PM-7AM, please contact night-coverage [email or web address] Password [email or web address] 06/21/2019, 1:23 PM

## 2019-06-22 LAB — COMPREHENSIVE METABOLIC PANEL
ALT: 16 U/L (ref 0–44)
AST: 31 U/L (ref 15–41)
Albumin: 2.3 g/dL — ABNORMAL LOW (ref 3.5–5.0)
Alkaline Phosphatase: 49 U/L (ref 38–126)
Anion gap: 10 (ref 5–15)
BUN: 22 mg/dL (ref 8–23)
CO2: 21 mmol/L — ABNORMAL LOW (ref 22–32)
Calcium: 8 mg/dL — ABNORMAL LOW (ref 8.9–10.3)
Chloride: 109 mmol/L (ref 98–111)
Creatinine, Ser: 0.99 mg/dL (ref 0.44–1.00)
GFR calc Af Amer: >60 mL/min (ref >60)
GFR calc non Af Amer: 57 mL/min — ABNORMAL LOW (ref >60)
Glucose, Bld: 64 mg/dL — ABNORMAL LOW (ref 70–99)
Potassium: 3.9 mmol/L (ref 3.5–5.1)
Sodium: 140 mmol/L (ref 135–145)
Total Bilirubin: 0.9 mg/dL (ref 0.3–1.2)
Total Protein: 5 g/dL — ABNORMAL LOW (ref 6.5–8.1)

## 2019-06-22 LAB — LIPASE, BLOOD: Lipase: 36 U/L (ref 11–51)

## 2019-06-22 MED ORDER — LOPERAMIDE HCL 2 MG PO CAPS
2.0000 mg | ORAL_CAPSULE | ORAL | Status: DC | PRN
  Administered 2019-06-22: 2 mg via ORAL
  Filled 2019-06-22 (×2): qty 1

## 2019-06-22 MED ORDER — LIDOCAINE 5 % EX PTCH
1.0000 | MEDICATED_PATCH | CUTANEOUS | Status: DC
  Administered 2019-06-22 – 2019-06-23 (×2): 1 via TRANSDERMAL
  Filled 2019-06-22 (×2): qty 1

## 2019-06-22 NOTE — Progress Notes (Signed)
PROGRESS NOTE    Teresa Mcknight  POE:[id number] DOB: October 05, 1946 DOA: 06/20/2019 PCP: Eldred Manges, MD    Brief Narrative:   Teresa Mcknight  is a 73 y.o. female, without significant past medical history, she presents to urgent care today secondary to complaints of epigastric abdominal pain, bloating, distention, over last 3 days, and nausea and vomiting over last 24 hours, worse abdominal pain is progressive, sharp, epigastric, continuous, she started to vomit yesterday, bilious vomiting, x5, reports constipated, requiring laxatives, denies fever, chills, coffee-ground emesis or diarrhea.  In ED, lipase was elevated at 275, potassium low at 3.4, ED abdomen and pelvis significant for pancreatitis with early necrosis, denies any history of alcohol use, no history of gallstones, I was called to admit.   Assessment & Plan:   Active Problems:   Acute pancreatitis  Acute idiopathic pancreatitis with necrosis Patient presenting with epigastric abdominal pain that is been progressing over the past 24 hours associated with bilious nausea and vomiting.  Lipase noted to be elevated at 275 on admission with CT abdomen/pelvis significant for extensive peripancreatic stranding and fluid and enhancement within the body/tail suggestive of early necrosis.  No gallstones or gallbladder wall thickening noted or biliary dilation.  Lipase within normal limits and no history of alcohol abuse. --Lipase 275-->36 --Start clear liquid diet today --NS at 100 mL's per hour --Continue Tylenol for pain control, patient declines narcotics --Follow lipase daily --Supportive care --Case was discussed with [facility] on call, Dr. Penelope Coop, by admitting hospitalist and no other recommendations at this time.  Will need follow-up with her primary gastroenterologist, Dr. Oletta Lamas following discharge with repeat CT abdomen/pelvis to ensure there is no malignancy once inflammation subsides.  Hypokalemia Repleted. --Repeat  electrolytes in a.m. to include magnesium.   DVT prophylaxis: Lovenox / SCDs Code Status: Full code Family Communication: None Disposition Plan: Continue inpatient hospitalization, IV fluid hydration, antiemetics, pain control, attempt transition diet to clear liquids today   Consultants:   None  Procedures:   None  Antimicrobials:   None   Subjective: Patient seen and examined at bedside, resting comfortably in bed.  He is with abdominal tension and mild epigastric pain.  Pain slightly improved.  Continues with poor appetite.  Discussed transition of diet to clear liquids today.  No other complaints or concerns at this time.  Denies headache, no fever/chills/night sweats, no vomiting/diarrhea, no chest pain, no palpitations, no shortness of breath, no weakness, no issues with bowel/bladder function, no paresthesias.  No acute events overnight per nurse staff.  Objective: Vitals:   06/21/19 2012 06/22/19 0337 06/22/19 0338 06/22/19 1019  BP: 105/66 (!) 93/50 (!) 95/51 109/69  Pulse: 86 96  (!) 101  Resp:      Temp: 98.3 F (36.8 C) 98.2 F (36.8 C)  98.4 F (36.9 C)  TempSrc: Axillary Oral  Oral  SpO2: 100% 99%  100%    Intake/Output Summary (Last 24 hours) at 06/22/2019 1123 Last data filed at 06/22/2019 0400 Gross per 24 hour  Intake 400 ml  Output -  Net 400 ml   There were no vitals filed for this visit.  Examination:  General exam: Appears calm and comfortable  Respiratory system: Clear to auscultation. Respiratory effort normal. Cardiovascular system: S1 & S2 heard, RRR. No JVD, murmurs, rubs, gallops or clicks. No pedal edema. Gastrointestinal system: Abdomen with mild distention, mild epigastric tenderness, soft. No organomegaly or masses felt. Normal bowel sounds heard. Central nervous system: Alert and oriented. No focal neurological  deficits. Extremities: Symmetric 5 x 5 power. Skin: No rashes, lesions or ulcers Psychiatry: Judgement and insight appear  normal. Mood & affect appropriate.     Data Reviewed: I have personally reviewed following labs and imaging studies  CBC: Recent Labs  Lab 06/20/19 0947 06/21/19 0223  WBC 8.5 8.3  HGB 13.0 12.6  HCT 40.5 38.2  MCV 98.1 96.0  PLT 145* 144*   Basic Metabolic Panel: Recent Labs  Lab 06/20/19 0947 06/21/19 0223 06/22/19 0256  NA 137 136 140  K 3.4* 4.2 3.9  CL 102 106 109  CO2 23 19* 21*  GLUCOSE 141* 95 64*  BUN 10 16 22   CREATININE 0.94 1.03* 0.99  CALCIUM 7.9* 7.4* 8.0*   GFR: CrCl cannot be calculated (Unknown ideal weight.). Liver Function Tests: Recent Labs  Lab 06/20/19 0947 06/21/19 0223 06/22/19 0256  AST 37 34 31  ALT 20 17 16   ALKPHOS 82 53 49  BILITOT 0.7 0.9 0.9  PROT 6.4* 5.2* 5.0*  ALBUMIN 3.2* 2.5* 2.3*   Recent Labs  Lab 06/20/19 0947 06/22/19 0256  LIPASE 275* 36   No results for input(s): AMMONIA in the last 168 hours. Coagulation Profile: No results for input(s): INR, PROTIME in the last 168 hours. Cardiac Enzymes: No results for input(s): CKTOTAL, CKMB, CKMBINDEX, TROPONINI in the last 168 hours. BNP (last 3 results) No results for input(s): PROBNP in the last 8760 hours. HbA1C: No results for input(s): HGBA1C in the last 72 hours. CBG: No results for input(s): GLUCAP in the last 168 hours. Lipid Profile: Recent Labs    06/20/19 1658  TRIG 49   Thyroid Function Tests: No results for input(s): TSH, T4TOTAL, FREET4, T3FREE, THYROIDAB in the last 72 hours. Anemia Panel: No results for input(s): VITAMINB12, FOLATE, FERRITIN, TIBC, IRON, RETICCTPCT in the last 72 hours. Sepsis Labs: No results for input(s): PROCALCITON, LATICACIDVEN in the last 168 hours.  Recent Results (from the past 240 hour(s))  SARS CORONAVIRUS 2 Nasal Swab Aptima Multi Swab     Status: None   Collection Time: 06/20/19  1:57 PM   Specimen: Aptima Multi Swab; Nasal Swab  Result Value Ref Range Status   SARS Coronavirus 2 NEGATIVE NEGATIVE Final     Comment: (NOTE) SARS-CoV-2 target nucleic acids are NOT DETECTED. The SARS-CoV-2 RNA is generally detectable in upper and lower respiratory specimens during the acute phase of infection. Negative results do not preclude SARS-CoV-2 infection, do not rule out co-infections with other pathogens, and should not be used as the sole basis for treatment or other patient management decisions. Negative results must be combined with clinical observations, patient history, and epidemiological information. The expected result is Negative. Fact Sheet for Patients: HairSlick.nohttps://www.fda.gov/media/138098/download Fact Sheet for Healthcare Providers: quierodirigir.comhttps://www.fda.gov/media/138095/download This test is not yet approved or cleared by the [place] FDA and  has been authorized for detection and/or diagnosis of SARS-CoV-2 by FDA under an Emergency Use Authorization (EUA). This EUA will remain  in effect (meaning this test can be used) for the duration of the COVID-19 declaration under Section 56 4(b)(1) of the Act, 21 U.S.C. section 360bbb-3(b)(1), unless the authorization is terminated or revoked sooner. Performed at [facility], [address] [place], [place] [address]          Radiology Studies: Ct Abdomen Pelvis W Contrast  Result Date: 06/20/2019 CLINICAL DATA:  Upper abdominal pain, bloating, distention EXAM: CT ABDOMEN AND PELVIS WITH CONTRAST TECHNIQUE: Multidetector CT imaging of the abdomen and pelvis  was performed using the standard protocol following bolus administration of intravenous contrast. CONTRAST:  100mL OMNIPAQUE IOHEXOL 300 MG/ML  SOLN COMPARISON:  None. FINDINGS: Lower chest: No acute abnormality. Hepatobiliary: No focal liver abnormality is seen. No gallstones, gallbladder wall thickening, or biliary dilatation. Pancreas: Pancreas is diffusely enlarged and edematous with extensive peripancreatic stranding and fluid. Areas of heterogeneous enhancement within the  body/tail suggesting early necrosis. More focal fluid collection along the inferior aspect of the pancreatic body and tail with thin incomplete rim enhancement (series 3, images 32-36). Spleen: Normal in size without focal abnormality. Adrenals/Urinary Tract: Adrenal glands are unremarkable. Kidneys are normal, without renal calculi, focal lesion, or hydronephrosis. Bladder is unremarkable. Stomach/Bowel: Diffuse submucosal edema of the gastric wall, likely reactive secondary to adjacent inflammation within the pancreas. There is also wall thickening throughout the duodenum. No dilated loops of bowel. Vascular/Lymphatic: No significant vascular findings are present. No enlarged abdominal or pelvic lymph nodes. Reproductive: Enlarged uterus with multiple heterogeneous, partially calcified fibroids. Other: Small volume ascites.  No abdominal wall hernia. Musculoskeletal: No acute or significant osseous findings. IMPRESSION: 1. Acute interstitial edematous pancreatitis. Areas of heterogeneous enhancement within the pancreatic body and tail suggestive of early necrosis. Moderate amount of peripancreatic fluid without well-defined capsule or rim enhancement. 2. Thickening and edema of the gastric and duodenal walls, likely reactive secondary to adjacent inflammation. These results were called by telephone at the time of interpretation on 06/20/2019 at 1:41 pm to Dr. Shawna OrleansMELANIE BELFI , who verbally acknowledged these results. Electronically Signed   By: Duanne GuessNicholas  Plundo M.D.   On: 06/20/2019 13:43   [place] Abdomen Limited Ruq  Result Date: 06/20/2019 CLINICAL DATA:  Epigastric pain.  Pancreatitis. EXAM: ULTRASOUND ABDOMEN LIMITED RIGHT UPPER QUADRANT COMPARISON:  CT scan dated 06/20/2019 FINDINGS: Gallbladder: No gallstones or wall thickening visualized. No sonographic Murphy sign noted by sonographer. Common bile duct: Obscured by bowel. The CT scan performed on this same date demonstrate a normal size of the common bile duct  with no significant intrahepatic ductal dilatation. Liver: No focal lesion identified. Within normal limits in parenchymal echogenicity. Portal vein is patent on color Doppler imaging with normal direction of blood flow towards the liver. Other: Small right pleural effusion. Fluid in the right perinephric space and in the peritoneal space adjacent to the right kidney. IMPRESSION: Normal appearing gallbladder.  No bile duct dilatation.  Ascites. Electronically Signed   By: Francene BoyersJames  Maxwell M.D.   On: 06/20/2019 16:30        Scheduled Meds: . enoxaparin (LOVENOX) injection  40 mg Subcutaneous Q24H  . senna  1 tablet Oral BID   Continuous Infusions: . sodium chloride 150 mL/hr at 06/21/19 1003     LOS: 2 days    Time spent: 29 minutes spent on chart review, personally reviewed all imaging studies and labs, discussion with nursing staff, consultants, updating family and interview/physical exam; more than 50% of that time was spent in counseling and/or coordination of care.    Alvira PhilipsEric J [place], DO [facility] Pager [phone]  If 7PM-7AM, please contact night-coverage [email or web address] Password [email or web address] 06/22/2019, 11:23 AM

## 2019-06-22 NOTE — Progress Notes (Addendum)
Initial Nutrition Assessment  DOCUMENTATION CODES:   Underweight  INTERVENTION:   RD will add supplements once diet advanced   NUTRITION DIAGNOSIS:   Inadequate oral intake related to acute illness as evidenced by NPO status.  GOAL:   Patient will meet greater than or equal to 90% of their needs  MONITOR:   Diet advancement, Labs, Weight trends, I & O's  REASON FOR ASSESSMENT:   Consult Assessment of nutrition requirement/status  ASSESSMENT:   73 y.o. female, without significant past medical history, she presents to urgent care today secondary to complaints of epigastric abdominal pain, bloating, distention, nausea and vomiting  RD working remotely.  RD received consult to help pt with meal choices as pt is vegan. Pt is currently NPO. Unable to reach pt by phone today. Per chart review, pt with nausea, vomiting and poor appetite for 3 days pta. CT scan reports pancreatitis. RD will order supplements and MVI once diet advanced. Will also help pt with meal choices once diet advanced. Per chart, pt with 9lbs(8%) weight loss since January; RD unsure how recently weight loss occurred.   Pt at high risk for malnutrition but unable to diagnose at this time as NFPE cannot be performed.   Medications reviewed and include: lovenox, senokot, NaCl @100ml /hr  Labs reviewed: K 3.9 wnl  Unable to complete Nutrition-Focused physical exam at this time.   Diet Order:   Diet Order            Diet NPO time specified Except for: Ice Chips, Sips with Meds  Diet effective now             EDUCATION NEEDS:   Education needs have been addressed  Skin:  Skin Assessment: Reviewed RN Assessment  Last BM:  8/8  Height:   Ht Readings from Last 1 Encounters:  11/30/18 5\' 5"  (1.651 m)    Weight:   Wt Readings from Last 1 Encounters:  06/20/19 49.9 kg    Ideal Body Weight:  56.8 kg  BMI:  There is no height or weight on file to calculate BMI.  Estimated Nutritional Needs:    Kcal:  1300-1500kcal/day  Protein:  65-75g/day  Fluid:  >1.3L/day  Koleen Distance MS, RD, LDN Pager #- [phone] Office#- [phone] After Hours Pager: [phone]

## 2019-06-22 NOTE — Progress Notes (Signed)
Spoke Dr. [place] regarding pt stating she feels more bloated and abdomen is more distended. Per MD to change diet back to NPO and to advance diet as tolerated. Will continue to monitor pt.

## 2019-06-23 LAB — COMPREHENSIVE METABOLIC PANEL
ALT: 17 U/L (ref 0–44)
AST: 28 U/L (ref 15–41)
Albumin: 2.2 g/dL — ABNORMAL LOW (ref 3.5–5.0)
Alkaline Phosphatase: 56 U/L (ref 38–126)
Anion gap: 10 (ref 5–15)
BUN: 13 mg/dL (ref 8–23)
CO2: 18 mmol/L — ABNORMAL LOW (ref 22–32)
Calcium: 7.9 mg/dL — ABNORMAL LOW (ref 8.9–10.3)
Chloride: 112 mmol/L — ABNORMAL HIGH (ref 98–111)
Creatinine, Ser: 0.91 mg/dL (ref 0.44–1.00)
GFR calc Af Amer: >60 mL/min (ref >60)
GFR calc non Af Amer: >60 mL/min (ref >60)
Glucose, Bld: 70 mg/dL (ref 70–99)
Potassium: 3.3 mmol/L — ABNORMAL LOW (ref 3.5–5.1)
Sodium: 140 mmol/L (ref 135–145)
Total Bilirubin: 0.6 mg/dL (ref 0.3–1.2)
Total Protein: 4.9 g/dL — ABNORMAL LOW (ref 6.5–8.1)

## 2019-06-23 LAB — LIPASE, BLOOD: Lipase: 19 U/L (ref 11–51)

## 2019-06-23 MED ORDER — IPRATROPIUM-ALBUTEROL 0.5-2.5 (3) MG/3ML IN SOLN
3.0000 mL | RESPIRATORY_TRACT | Status: DC | PRN
  Administered 2019-06-23: 3 mL via RESPIRATORY_TRACT
  Filled 2019-06-23: qty 3

## 2019-06-23 MED ORDER — POTASSIUM CHLORIDE CRYS ER 20 MEQ PO TBCR
30.0000 meq | EXTENDED_RELEASE_TABLET | ORAL | Status: AC
  Administered 2019-06-23 (×2): 30 meq via ORAL
  Filled 2019-06-23 (×2): qty 1

## 2019-06-23 NOTE — Plan of Care (Signed)
  Problem: Pain Managment: Goal: General experience of comfort will improve Outcome: Progressing   Problem: Safety: Goal: Ability to remain free from injury will improve Outcome: Progressing   Problem: Skin Integrity: Goal: Risk for impaired skin integrity will decrease Outcome: Progressing   

## 2019-06-23 NOTE — Progress Notes (Signed)
PROGRESS NOTE    Teresa Mcknight  ZOX:[id number] DOB: 11/29/1945 DOA: 06/20/2019 PCP: Hal MoralesHaygood, Vanessa P, MD    Brief Narrative:   Teresa Mcknight  is a 73 y.o. female, without significant past medical history, she presents to urgent care today secondary to complaints of epigastric abdominal pain, bloating, distention, over last 3 days, and nausea and vomiting over last 24 hours, worse abdominal pain is progressive, sharp, epigastric, continuous, she started to vomit yesterday, bilious vomiting, x5, reports constipated, requiring laxatives, denies fever, chills, coffee-ground emesis or diarrhea.  In ED, lipase was elevated at 275, potassium low at 3.4, ED abdomen and pelvis significant for pancreatitis with early necrosis, denies any history of alcohol use, no history of gallstones, I was called to admit.   Assessment & Plan:   Active Problems:   Acute pancreatitis  Acute idiopathic pancreatitis with necrosis Patient presenting with epigastric abdominal pain that is been progressing over the past 24 hours associated with bilious nausea and vomiting.  Lipase noted to be elevated at 275 on admission with CT abdomen/pelvis significant for extensive peripancreatic stranding and fluid and enhancement within the body/tail suggestive of early necrosis.  No gallstones or gallbladder wall thickening noted or biliary dilation.  Lipase within normal limits and no history of alcohol abuse. Case was discussed with [facility] on call, Dr. Evette CristalGanem, by admitting hospitalist and no other recommendations at this time.   --Lipase 275-->36-->19 --clear liquid diet today advance as tolerated --d/c IVF today --Continue Tylenol for pain control, patient declines narcotics --Supportive care --Will need follow-up with her primary gastroenterologist, Dr. Randa EvensEdwards following discharge with repeat CT abdomen/pelvis to ensure there is no malignancy once inflammation subsides.   Hypokalemia K 3.3 today, will replete --Repeat  electrolytes in a.m. to include magnesium.   DVT prophylaxis: Lovenox / SCDs Code Status: Full code Family Communication: None Disposition Plan: Continue inpatient hospitalization, transition diet, antiemetics, pain control, anticipate discharge home in 1-2 days  Consultants:   None  Procedures:   None  Antimicrobials:   None   Subjective: Patient seen and examined at bedside, resting comfortably in bed. Continues to be concerned with abdominal distention/bloating. Discussed with her multiple times that she has severe pancreatitis that has caused an immense amount of inflammation and fluid shifts in her abdomen to include a small amount of ascites and will take time until she can see and feel full resolution.  No other complaints or concerns at this time.  Denies headache, no fever/chills/night sweats, no vomiting/diarrhea, no chest pain, no palpitations, no shortness of breath, no weakness, no issues with bowel/bladder function, no paresthesias.  No acute events overnight per nurse staff.  Objective: Vitals:   06/22/19 1955 06/23/19 0407 06/23/19 0646 06/23/19 0900  BP: (!) 95/56 (!) 84/40 110/65 117/73  Pulse: 63 78  90  Resp:      Temp: 98.3 F (36.8 C) 98.4 F (36.9 C)  98.4 F (36.9 C)  TempSrc: Oral Oral  Oral  SpO2: 100% 100%  100%    Intake/Output Summary (Last 24 hours) at 06/23/2019 1133 Last data filed at 06/22/2019 1552 Gross per 24 hour  Intake 2516.22 ml  Output -  Net 2516.22 ml   There were no vitals filed for this visit.  Examination:  General exam: Appears calm and comfortable  Respiratory system: Clear to auscultation. Respiratory effort normal. Cardiovascular system: S1 & S2 heard, RRR. No JVD, murmurs, rubs, gallops or clicks. No pedal edema. Gastrointestinal system: Abdomen with mild distention, mild epigastric  tenderness, soft. No organomegaly or masses felt. Normal bowel sounds heard. Central nervous system: Alert and oriented. No focal  neurological deficits. Extremities: Symmetric 5 x 5 power. Skin: No rashes, lesions or ulcers Psychiatry: Judgement and insight appear normal. Mood & affect appropriate.     Data Reviewed: I have personally reviewed following labs and imaging studies  CBC: Recent Labs  Lab 06/20/19 0947 06/21/19 0223  WBC 8.5 8.3  HGB 13.0 12.6  HCT 40.5 38.2  MCV 98.1 96.0  PLT 145* 409*   Basic Metabolic Panel: Recent Labs  Lab 06/20/19 0947 06/21/19 0223 06/22/19 0256 06/23/19 0616  NA 137 136 140 140  K 3.4* 4.2 3.9 3.3*  CL 102 106 109 112*  CO2 23 19* 21* 18*  GLUCOSE 141* 95 64* 70  BUN 10 16 22 13   CREATININE 0.94 1.03* 0.99 0.91  CALCIUM 7.9* 7.4* 8.0* 7.9*   GFR: CrCl cannot be calculated (Unknown ideal weight.). Liver Function Tests: Recent Labs  Lab 06/20/19 0947 06/21/19 0223 06/22/19 0256 06/23/19 0616  AST 37 34 31 28  ALT 20 17 16 17   ALKPHOS 82 53 49 56  BILITOT 0.7 0.9 0.9 0.6  PROT 6.4* 5.2* 5.0* 4.9*  ALBUMIN 3.2* 2.5* 2.3* 2.2*   Recent Labs  Lab 06/20/19 0947 06/22/19 0256 06/23/19 0616  LIPASE 275* 36 19   No results for input(s): AMMONIA in the last 168 hours. Coagulation Profile: No results for input(s): INR, PROTIME in the last 168 hours. Cardiac Enzymes: No results for input(s): CKTOTAL, CKMB, CKMBINDEX, TROPONINI in the last 168 hours. BNP (last 3 results) No results for input(s): PROBNP in the last 8760 hours. HbA1C: No results for input(s): HGBA1C in the last 72 hours. CBG: No results for input(s): GLUCAP in the last 168 hours. Lipid Profile: Recent Labs    06/20/19 1658  TRIG 49   Thyroid Function Tests: No results for input(s): TSH, T4TOTAL, FREET4, T3FREE, THYROIDAB in the last 72 hours. Anemia Panel: No results for input(s): VITAMINB12, FOLATE, FERRITIN, TIBC, IRON, RETICCTPCT in the last 72 hours. Sepsis Labs: No results for input(s): PROCALCITON, LATICACIDVEN in the last 168 hours.  Recent Results (from the past 240  hour(s))  SARS CORONAVIRUS 2 Nasal Swab Aptima Multi Swab     Status: None   Collection Time: 06/20/19  1:57 PM   Specimen: Aptima Multi Swab; Nasal Swab  Result Value Ref Range Status   SARS Coronavirus 2 NEGATIVE NEGATIVE Final    Comment: (NOTE) SARS-CoV-2 target nucleic acids are NOT DETECTED. The SARS-CoV-2 RNA is generally detectable in upper and lower respiratory specimens during the acute phase of infection. Negative results do not preclude SARS-CoV-2 infection, do not rule out co-infections with other pathogens, and should not be used as the sole basis for treatment or other patient management decisions. Negative results must be combined with clinical observations, patient history, and epidemiological information. The expected result is Negative. Fact Sheet for Patients: SugarRoll.be Fact Sheet for Healthcare Providers: https://www.woods-mathews.com/ This test is not yet approved or cleared by the [place] FDA and  has been authorized for detection and/or diagnosis of SARS-CoV-2 by FDA under an Emergency Use Authorization (EUA). This EUA will remain  in effect (meaning this test can be used) for the duration of the COVID-19 declaration under Section 56 4(b)(1) of the Act, 21 U.S.C. section 360bbb-3(b)(1), unless the authorization is terminated or revoked sooner. Performed at [facility], [place] [address], [place], [place] [address]  Radiology Studies: No results found.      Scheduled Meds: . enoxaparin (LOVENOX) injection  40 mg Subcutaneous Q24H  . lidocaine  1 patch Transdermal Q24H  . potassium chloride  30 mEq Oral Q3H  . senna  1 tablet Oral BID   Continuous Infusions:    LOS: 3 days    Time spent: 35 minutes spent on chart review, personally reviewed all imaging studies and labs, discussion with nursing staff, consultants, updating family and interview/physical exam; more than 50% of that  time was spent in counseling and/or coordination of care.    Alvira PhilipsEric J [place], DO [facility] Pager [phone]  If 7PM-7AM, please contact night-coverage [email or web address] Password [email or web address] 06/23/2019, 11:33 AM

## 2019-06-23 NOTE — Progress Notes (Signed)
Notified Dr Osa Craver, [facility] of patient status. BP 84/40, P78, 100 sats on RA. She says she feels SOB, not obvious to nurse. lungs sound clear, heart is bounding. Will continue to monitor patient.

## 2019-06-24 ENCOUNTER — Encounter (HOSPITAL_COMMUNITY): Payer: Self-pay

## 2019-06-24 LAB — BASIC METABOLIC PANEL
Anion gap: 8 (ref 5–15)
BUN: 7 mg/dL — ABNORMAL LOW (ref 8–23)
CO2: 21 mmol/L — ABNORMAL LOW (ref 22–32)
Calcium: 8.1 mg/dL — ABNORMAL LOW (ref 8.9–10.3)
Chloride: 111 mmol/L (ref 98–111)
Creatinine, Ser: 0.87 mg/dL (ref 0.44–1.00)
GFR calc Af Amer: >60 mL/min (ref >60)
GFR calc non Af Amer: >60 mL/min (ref >60)
Glucose, Bld: 88 mg/dL (ref 70–99)
Potassium: 3.7 mmol/L (ref 3.5–5.1)
Sodium: 140 mmol/L (ref 135–145)

## 2019-06-24 LAB — MAGNESIUM: Magnesium: 2 mg/dL (ref 1.7–2.4)

## 2019-06-24 LAB — LIPASE, BLOOD: Lipase: 17 U/L (ref 11–51)

## 2019-06-24 MED ORDER — GUAIFENESIN-DM 100-10 MG/5ML PO SYRP
5.0000 mL | ORAL_SOLUTION | ORAL | Status: DC | PRN
  Administered 2019-06-24: 5 mL via ORAL
  Filled 2019-06-24 (×2): qty 10

## 2019-06-24 NOTE — Discharge Summary (Signed)
Physician Discharge Summary  Teresa Mcknight GEX:[id number] DOB: 12/01/1945 DOA: 06/20/2019  PCP: Hal Morales, MD  Admit date: 06/20/2019 Discharge date: 06/24/2019  Admitted From: Home Disposition: Home  Recommendations for Outpatient Follow-up:  1. Follow up with PCP as scheduled on 06/27/2019 2. Follow-up with gastroenterology, Dr. Randa Evens in 2 weeks 3. Radiology recommends interval CT abdomen/pelvis for further evaluation of pancreas following acute pancreatitis flare  Home Health: No Equipment/Devices: None  Discharge Condition: Stable CODE STATUS: Full code Diet recommendation: Low-fat, bland diet  History of present illness:  Teresa Mcknight a73 y.o.female,without significant past medical history, she presents to urgent care today secondary to complaints of epigastric abdominal pain, bloating, distention, over last 3 days, and nausea and vomiting over last 24 hours, worse abdominal pain is progressive, sharp, epigastric, continuous, she started to vomit yesterday, bilious vomiting, x5, reports constipated, requiring laxatives, denies fever, chills, coffee-ground emesis or diarrhea.  In ED,lipase was elevated at 275,potassium low at 3.4, ED abdomen and pelvis significant for pancreatitis with early necrosis, denies any history of alcohol use, no history of gallstones, I was called to admit.  Hospital course:  Acute idiopathic pancreatitis with necrosis Patient presenting with epigastric abdominal pain that is been progressing over the past 24 hours associated with bilious nausea and vomiting.  Lipase noted to be elevated at 275 on admission with CT abdomen/pelvis significant for extensive peripancreatic stranding and fluid and enhancement within the body/tail suggestive of early necrosis.  No gallstones or gallbladder wall thickening noted or biliary dilation.  Lipase within normal limits and no history of alcohol abuse.  Normal triglycerides.  Case was discussed with  [facility] on call, Dr. Evette Cristal, by admitting hospitalist and no other recommendations at this time.    Patient was started on IV fluid hydration and kept n.p.o. with improvement of her symptoms.  Her diet was slowly advanced with lipase trending down from 275 to17 at time of discharge.  Discussed with patient to maintain a bland low-fat diet over the next week.  Will need follow-up with her primary gastroenterologist, Dr. Randa Evens following discharge with repeat CT abdomen/pelvis to ensure there is no malignancy once inflammation subsides.   Hypokalemia Repleted during hospitalization.  Potassium 3.7 at time of discharge.  Discharge Diagnoses:  Active Problems:   Acute pancreatitis    Discharge Instructions  Discharge Instructions    Call MD for:  difficulty breathing, headache or visual disturbances   Complete by: As directed    Call MD for:  extreme fatigue   Complete by: As directed    Call MD for:  persistant dizziness or light-headedness   Complete by: As directed    Call MD for:  persistant nausea and vomiting   Complete by: As directed    Call MD for:  severe uncontrolled pain   Complete by: As directed    Call MD for:  temperature >100.4   Complete by: As directed    Diet - low sodium heart healthy   Complete by: As directed    Increase activity slowly   Complete by: As directed      Allergies as of 06/24/2019      Reactions   Aspirin Other (See Comments)   Sore stomach   Dairy Aid [lactase]    Causes Mucus   Latex Itching   Valtrex [valacyclovir Hcl] Nausea And Vomiting      Medication List    TAKE these medications   acyclovir 400 MG tablet Commonly known as: ZOVIRAX Take 1 tablet (  400 mg total) by mouth 3 (three) times daily. Prn for flare ups,start < 48 hrs after sx onset and for 5 days. What changed:   how much to take  when to take this   VITAMIN B1-B12 PO Take 5,000 mg by mouth at bedtime.   VITAMIN C PO Take 500 mg by mouth at bedtime.   Vitamin  D3 50 MCG (2000 UT) Tabs Take 2,000 Units by mouth at bedtime.      Follow-up Information    Eldred Manges, MD Follow up on 06/27/2019.   Specialty: Obstetrics and Gynecology Contact information: [address] [place] [address] [phone]        Skeet Latch, MD .   Specialty: Cardiology Contact information: [address] [place] [address] [phone]        Laurence Spates, MD. Schedule an appointment as soon as possible for a visit in 2 week(s).   Specialty: Gastroenterology Contact information: [address] [place] [address] [phone]          Allergies  Allergen Reactions  . Aspirin Other (See Comments)    Sore stomach  . Dairy Aid [Lactase]     Causes Mucus  . Latex Itching  . Valtrex [Valacyclovir Hcl] Nausea And Vomiting    Consultations:  none   Procedures/Studies: Ct Abdomen Pelvis W Contrast  Result Date: 06/20/2019 CLINICAL DATA:  Upper abdominal pain, bloating, distention EXAM: CT ABDOMEN AND PELVIS WITH CONTRAST TECHNIQUE: Multidetector CT imaging of the abdomen and pelvis was performed using the standard protocol following bolus administration of intravenous contrast. CONTRAST:  138mL OMNIPAQUE IOHEXOL 300 MG/ML  SOLN COMPARISON:  None. FINDINGS: Lower chest: No acute abnormality. Hepatobiliary: No focal liver abnormality is seen. No gallstones, gallbladder wall thickening, or biliary dilatation. Pancreas: Pancreas is diffusely enlarged and edematous with extensive peripancreatic stranding and fluid. Areas of heterogeneous enhancement within the body/tail suggesting early necrosis. More focal fluid collection along the inferior aspect of the pancreatic body and tail with thin incomplete rim enhancement (series 3, images 32-36). Spleen: Normal in size without focal abnormality. Adrenals/Urinary Tract: Adrenal glands are unremarkable. Kidneys are normal, without renal calculi, focal  lesion, or hydronephrosis. Bladder is unremarkable. Stomach/Bowel: Diffuse submucosal edema of the gastric wall, likely reactive secondary to adjacent inflammation within the pancreas. There is also wall thickening throughout the duodenum. No dilated loops of bowel. Vascular/Lymphatic: No significant vascular findings are present. No enlarged abdominal or pelvic lymph nodes. Reproductive: Enlarged uterus with multiple heterogeneous, partially calcified fibroids. Other: Small volume ascites.  No abdominal wall hernia. Musculoskeletal: No acute or significant osseous findings. IMPRESSION: 1. Acute interstitial edematous pancreatitis. Areas of heterogeneous enhancement within the pancreatic body and tail suggestive of early necrosis. Moderate amount of peripancreatic fluid without well-defined capsule or rim enhancement. 2. Thickening and edema of the gastric and duodenal walls, likely reactive secondary to adjacent inflammation. These results were called by telephone at the time of interpretation on 06/20/2019 at 1:41 pm to Dr. Threasa Beards BELFI , who verbally acknowledged these results. Electronically Signed   By: Davina Poke M.D.   On: 06/20/2019 13:43   US Abdomen Limited Ruq  Result Date: 06/20/2019 CLINICAL DATA:  Epigastric pain.  Pancreatitis. EXAM: ULTRASOUND ABDOMEN LIMITED RIGHT UPPER QUADRANT COMPARISON:  CT scan dated 06/20/2019 FINDINGS: Gallbladder: No gallstones or wall thickening visualized. No sonographic Murphy sign noted by sonographer. Common bile duct: Obscured by bowel. The CT scan performed on this same date demonstrate  a normal size of the common bile duct with no significant intrahepatic ductal dilatation. Liver: No focal lesion identified. Within normal limits in parenchymal echogenicity. Portal vein is patent on color Doppler imaging with normal direction of blood flow towards the liver. Other: Small right pleural effusion. Fluid in the right perinephric space and in the peritoneal space  adjacent to the right kidney. IMPRESSION: Normal appearing gallbladder.  No bile duct dilatation.  Ascites. Electronically Signed   By: Francene BoyersJames  Maxwell M.D.   On: 06/20/2019 16:30      Subjective: Patient seen and examined at bedside, resting comfortably reading in bedside chair.  Concerned about some edema in her lower extremities, none appreciated on physical exam.  Tolerating advanced diet.  No nausea/vomiting.  Ready for discharge home.  Denies headache, no fever/chills/night sweats, no nausea cefonicid diarrhea, no chest pain, palpitations, no abdominal pain, no weakness, no issues with bowel/bladder function, no paresthesias.  No acute events overnight per nursing staff.   Discharge Exam: Vitals:   06/24/19 0417 06/24/19 0800  BP: 106/63 (!) 121/57  Pulse: 74 71  Resp: 16 16  Temp: 98.6 F (37 C) 99 F (37.2 C)  SpO2: 94% 98%   Vitals:   06/23/19 1845 06/23/19 1932 06/24/19 0417 06/24/19 0800  BP: (!) 108/53 113/60 106/63 (!) 121/57  Pulse: 73 66 74 71  Resp:  16 16 16   Temp: 98.2 F (36.8 C) 99 F (37.2 C) 98.6 F (37 C) 99 F (37.2 C)  TempSrc: Oral Oral Oral Oral  SpO2: 100% 99% 94% 98%    General: Pt is alert, awake, not in acute distress Cardiovascular: RRR, S1/S2 +, no rubs, no gallops Respiratory: CTA bilaterally, no wheezing, no rhonchi Abdominal: Soft, NT, mild abdominal distention, bowel sounds + Extremities: no edema, no cyanosis    The results of significant diagnostics from this hospitalization (including imaging, microbiology, ancillary and laboratory) are listed below for reference.     Microbiology: Recent Results (from the past 240 hour(s))  SARS CORONAVIRUS 2 Nasal Swab Aptima Multi Swab     Status: None   Collection Time: 06/20/19  1:57 PM   Specimen: Aptima Multi Swab; Nasal Swab  Result Value Ref Range Status   SARS Coronavirus 2 NEGATIVE NEGATIVE Final    Comment: (NOTE) SARS-CoV-2 target nucleic acids are NOT DETECTED. The SARS-CoV-2  RNA is generally detectable in upper and lower respiratory specimens during the acute phase of infection. Negative results do not preclude SARS-CoV-2 infection, do not rule out co-infections with other pathogens, and should not be used as the sole basis for treatment or other patient management decisions. Negative results must be combined with clinical observations, patient history, and epidemiological information. The expected result is Negative. Fact Sheet for Patients: HairSlick.nohttps://www.fda.gov/media/138098/download Fact Sheet for Healthcare Providers: quierodirigir.comhttps://www.fda.gov/media/138095/download This test is not yet approved or cleared by the [place] FDA and  has been authorized for detection and/or diagnosis of SARS-CoV-2 by FDA under an Emergency Use Authorization (EUA). This EUA will remain  in effect (meaning this test can be used) for the duration of the COVID-19 declaration under Section 56 4(b)(1) of the Act, 21 U.S.C. section 360bbb-3(b)(1), unless the authorization is terminated or revoked sooner. Performed at [facility], [address] [place], [place] [address]      Labs: BNP (last 3 results) No results for input(s): BNP in the last 8760 hours. Basic Metabolic Panel: Recent Labs  Lab 06/20/19 [id number] 06/21/19 0223 06/22/19 0256 06/23/19 [id number] 06/24/19 0430  NA 137 136 140 140 140  K 3.4* 4.2 3.9 3.3* 3.7  CL 102 106 109 112* 111  CO2 23 19* 21* 18* 21*  GLUCOSE 141* 95 64* 70 88  BUN 10 16 22 13  7*  CREATININE 0.94 1.03* 0.99 0.91 0.87  CALCIUM 7.9* 7.4* 8.0* 7.9* 8.1*  MG  --   --   --   --  2.0   Liver Function Tests: Recent Labs  Lab 06/20/19 0947 06/21/19 0223 06/22/19 0256 06/23/19 0616  AST 37 34 31 28  ALT 20 17 16 17   ALKPHOS 82 53 49 56  BILITOT 0.7 0.9 0.9 0.6  PROT 6.4* 5.2* 5.0* 4.9*  ALBUMIN 3.2* 2.5* 2.3* 2.2*   Recent Labs  Lab 06/20/19 0947 06/22/19 0256 06/23/19 0616 06/24/19 0430  LIPASE 275* 36 19 17   No results  for input(s): AMMONIA in the last 168 hours. CBC: Recent Labs  Lab 06/20/19 0947 06/21/19 0223  WBC 8.5 8.3  HGB 13.0 12.6  HCT 40.5 38.2  MCV 98.1 96.0  PLT 145* 144*   Cardiac Enzymes: No results for input(s): CKTOTAL, CKMB, CKMBINDEX, TROPONINI in the last 168 hours. BNP: Invalid input(s): POCBNP CBG: No results for input(s): GLUCAP in the last 168 hours. D-Dimer No results for input(s): DDIMER in the last 72 hours. Hgb A1c No results for input(s): HGBA1C in the last 72 hours. Lipid Profile No results for input(s): CHOL, HDL, LDLCALC, TRIG, CHOLHDL, LDLDIRECT in the last 72 hours. Thyroid function studies No results for input(s): TSH, T4TOTAL, T3FREE, THYROIDAB in the last 72 hours.  Invalid input(s): FREET3 Anemia work up No results for input(s): VITAMINB12, FOLATE, FERRITIN, TIBC, IRON, RETICCTPCT in the last 72 hours. Urinalysis    Component Value Date/Time   COLORURINE YELLOW 06/20/2019 0944   APPEARANCEUR CLEAR 06/20/2019 0944   LABSPEC 1.027 06/20/2019 0944   PHURINE 6.0 06/20/2019 0944   GLUCOSEU 50 (A) 06/20/2019 0944   HGBUR NEGATIVE 06/20/2019 0944   BILIRUBINUR NEGATIVE 06/20/2019 0944   KETONESUR 80 (A) 06/20/2019 0944   PROTEINUR 100 (A) 06/20/2019 0944   NITRITE NEGATIVE 06/20/2019 0944   LEUKOCYTESUR NEGATIVE 06/20/2019 0944   Sepsis Labs Invalid input(s): PROCALCITONIN,  WBC,  LACTICIDVEN Microbiology Recent Results (from the past 240 hour(s))  SARS CORONAVIRUS 2 Nasal Swab Aptima Multi Swab     Status: None   Collection Time: 06/20/19  1:57 PM   Specimen: Aptima Multi Swab; Nasal Swab  Result Value Ref Range Status   SARS Coronavirus 2 NEGATIVE NEGATIVE Final    Comment: (NOTE) SARS-CoV-2 target nucleic acids are NOT DETECTED. The SARS-CoV-2 RNA is generally detectable in upper and lower respiratory specimens during the acute phase of infection. Negative results do not preclude SARS-CoV-2 infection, do not rule out co-infections with  other pathogens, and should not be used as the sole basis for treatment or other patient management decisions. Negative results must be combined with clinical observations, patient history, and epidemiological information. The expected result is Negative. Fact Sheet for Patients: HairSlick.nohttps://www.fda.gov/media/138098/download Fact Sheet for Healthcare Providers: quierodirigir.comhttps://www.fda.gov/media/138095/download This test is not yet approved or cleared by the [place] FDA and  has been authorized for detection and/or diagnosis of SARS-CoV-2 by FDA under an Emergency Use Authorization (EUA). This EUA will remain  in effect (meaning this test can be used) for the duration of the COVID-19 declaration under Section 56 4(b)(1) of the Act, 21 U.S.C. section 360bbb-3(b)(1), unless the authorization is terminated or revoked sooner. Performed at [facility], [address] [place], [place] [address]      Time coordinating discharge: Over 30 minutes  SIGNED:   Alvira PhilipsEric J [place], DO  [facility] 06/24/2019, 9:43 AM

## 2019-06-24 NOTE — Care Management Important Message (Signed)
Important Message  Patient Details  Name: Teresa Mcknight MRN: [id number] Date of Birth: 11-Nov-1946   Medicare Important Message Given:  Yes     Memory Argue 06/24/2019, 4:18 PM

## 2019-06-24 NOTE — Discharge Instructions (Signed)
Acute Pancreatitis ° °Acute pancreatitis happens when the pancreas gets swollen. The pancreas is a large gland in the body that helps to control blood sugar. It also makes enzymes that help to digest food. °This condition can last a few days and cause serious problems. The lungs, heart, and kidneys may stop working. °What are the causes? °Causes include: °· Alcohol abuse. °· Drug abuse. °· Gallstones. °· A tumor in the pancreas. °Other causes include: °· Some medicines. °· Some chemicals. °· Diabetes. °· An infection. °· Damage caused by an accident. °· The poison (venom) from a scorpion bite. °· Belly (abdominal) surgery. °· The body's defense system (immune system) attacking the pancreas (autoimmune pancreatitis). °· Genes that are passed from parent to child (inherited). °In some cases, the cause is not known. °What are the signs or symptoms? °· Pain in the upper belly that may be felt in the back. The pain may be very bad. °· Swelling of the belly. °· Feeling sick to your stomach (nauseous) and throwing up (vomiting). °· Fever. °How is this treated? °You will likely have to stay in the hospital. Treatment may include: °· Pain medicine. °· Fluid through an IV tube. °· Placing a tube in the stomach to take out the stomach contents. This may help you stop throwing up. °· Not eating for 3-4 days. °· Antibiotic medicines, if you have an infection. °· Treating any other problems that may be the cause. °· Steroid medicines, if your problem is caused by your defense system attacking your body's own tissues. °· Surgery. °Follow these instructions at home: °Eating and drinking ° °· Follow instructions from your doctor about what to eat and drink. °· Eat foods that do not have a lot of fat in them. °· Eat small meals often. Do not eat big meals. °· Drink enough fluid to keep your pee (urine) pale yellow. °· Do not drink alcohol if it caused your condition. °Medicines °· Take over-the-counter and prescription medicines only  as told by your doctor. °· Ask your doctor if the medicine prescribed to you: °? Requires you to avoid driving or using heavy machinery. °? Can cause trouble pooping (constipation). You may need to take steps to prevent or treat trouble pooping: °§ Take over-the-counter or prescription medicines. °§ Eat foods that are high in fiber. These include beans, whole grains, and fresh fruits and vegetables. °§ Limit foods that are high in fat and sugar. These include fried or sweet foods. °General instructions °· Do not use any products that contain nicotine or tobacco, such as cigarettes, e-cigarettes, and chewing tobacco. If you need help quitting, ask your doctor. °· Get plenty of rest. °· Check your blood sugar at home as told by your doctor. °· Keep all follow-up visits as told by your doctor. This is important. °Contact a doctor if: °· You do not get better as quickly as expected. °· You have new symptoms. °· Your symptoms get worse. °· You have pain or weakness that lasts a long time. °· You keep feeling sick to your stomach. °· You get better and then you have pain again. °· You have a fever. °Get help right away if: °· You cannot eat or keep fluids down. °· Your pain gets very bad. °· Your skin or the white part of your eyes turns yellow. °· You have sudden swelling in your belly. °· You throw up. °· You feel dizzy or you pass out (faint). °· Your blood sugar is high (over 300   mg/dL). °Summary °· Acute pancreatitis happens when the pancreas gets swollen. °· This condition is often caused by alcohol abuse, drug abuse, or gallstones. °· You will likely have to stay in the hospital for treatment. °This information is not intended to replace advice given to you by your health care provider. Make sure you discuss any questions you have with your health care provider. °Document Released: 04/18/2008 Document Revised: 08/20/2018 Document Reviewed: 08/20/2018 °Elsevier Patient Education © 2020 Elsevier  Inc. ° ° °Pancreatitis Eating Plan °Pancreatitis is when your pancreas becomes irritated and swollen (inflamed). The pancreas is a small organ located behind your stomach. It helps your body digest food and regulate your blood sugar. Pancreatitis can affect how your body digests food, especially foods with fat. You may also have other symptoms such as abdominal pain or nausea. °When you have pancreatitis, following a low-fat eating plan may help you manage symptoms and recover more quickly. Work with your health care provider or a diet and nutrition specialist (dietitian) to create an eating plan that is right for you. °What are tips for following this plan? °Reading food labels °Use the information on food labels to help keep track of how much fat you eat: °· Check the serving size. °· Look for the amount of total fat in grams (g) in one serving. °? Low-fat foods have 3 g of fat or less per serving. °? Fat-free foods have 0.5 g of fat or less per serving. °· Keep track of how much fat you eat based on how many servings you eat. °? For example, if you eat two servings, the amount of fat you eat will be two times what is listed on the label. °Shopping ° °· Buy low-fat or nonfat foods, such as: °? Fresh, frozen, or canned fruits and vegetables. °? Grains, including pasta, bread, and rice. °? Lean meat, poultry, fish, and other protein foods. °? Low-fat or nonfat dairy. °· Avoid buying bakery products and other sweets made with whole milk, butter, and eggs. °· Avoid buying snack foods with added fat, such as anything with butter or cheese flavoring. °Cooking °· Remove skin from poultry, and remove extra fat from meat. °· Limit the amount of fat and oil you use to 6 teaspoons or less per day. °· Cook using low-fat methods, such as boiling, broiling, grilling, steaming, or baking. °· Use spray oil to cook. Add fat-free chicken broth to add flavor and moisture. °· Avoid adding cream to thicken soups or sauces. Use other  thickeners such as corn starch or tomato paste. °Meal planning ° °· Eat a low-fat diet as told by your dietitian. For most people, this means having no more than 55-65 grams of fat each day. °· Eat small, frequent meals throughout the day. For example, you may have 5-6 small meals instead of 3 large meals. °· Drink enough fluid to keep your urine pale yellow. °· Do not drink alcohol. Talk to your health care provider if you need help stopping. °· Limit how much caffeine you have, including black coffee, black and green tea, caffeinated soft drinks, and energy drinks. °General information °· Let your health care provider or dietitian know if you have unplanned weight loss on this eating plan. °· You may be instructed to follow a clear liquid diet during a flare of symptoms. Talk with your health care provider about how to manage your diet during symptoms of a flare. °· Take any vitamins or supplements as told by your health care   provider. °· Work with a dietitian, especially if you have other conditions such as obesity or diabetes mellitus. °What foods should I avoid? °Fruits °Fried fruits. Fruits served with butter or cream. °Vegetables °Fried vegetables. Vegetables cooked with butter, cheese, or cream. °Grains °Biscuits, waffles, donuts, pastries, and croissants. Pies and cookies. Butter-flavored popcorn. Regular crackers. °Meats and other protein foods °Fatty cuts of meat. Poultry with skin. Organ meats. Bacon, sausage, and cold cuts. Whole eggs. Nuts and nut butters. °Dairy °Whole and 2% milk. Whole milk yogurt. Whole milk ice cream. Cream and half-and-half. Cream cheese. Sour cream. Cheese. °Beverages °Wine, beer, and liquor. °The items listed above may not be a complete list of foods and beverages to avoid. Contact a dietitian for more information. °Summary °· Pancreatitis can affect how your body digests food, especially foods with fat. °· When you have pancreatitis, it is recommended that you follow a low-fat  eating plan to help you recover more quickly and manage symptoms. For most people, this means limiting fat to no more than 55-65 grams per day. °· Do not drink alcohol. Limit the amount of caffeine you have, and drink enough fluid to keep your urine pale yellow. °This information is not intended to replace advice given to you by your health care provider. Make sure you discuss any questions you have with your health care provider. °Document Released: 02/06/2018 Document Revised: 02/21/2019 Document Reviewed: 02/06/2018 °Elsevier Patient Education © 2020 Elsevier Inc. ° °

## 2019-06-24 NOTE — Progress Notes (Signed)
Pt is A&O x4, ambulatory, feeling better today. Tolerating soft diet. Discharge instructions given to pt. Discharged to home.

## 2019-06-24 NOTE — Plan of Care (Signed)
  Problem: Activity: Goal: Risk for activity intolerance will decrease Outcome: Progressing   Problem: Nutrition: Goal: Adequate nutrition will be maintained Outcome: Progressing   Problem: Coping: Goal: Level of anxiety will decrease Outcome: Progressing   Problem: Pain Managment: Goal: General experience of comfort will improve Outcome: Progressing   Problem: Safety: Goal: Ability to remain free from injury will improve Outcome: Progressing   

## 2019-06-26 NOTE — Progress Notes (Signed)
Patient Care Team: Patient, No Pcp Per as PCP - General (General Practice) Skeet Latch, MD as PCP - Cardiology (Cardiology)  DIAGNOSIS:    ICD-10-CM   1. Neutropenia, unspecified type ([place])  D70.9     CHIEF COMPLIANT: Follow-up of neutropenia and thrombocytopenia  INTERVAL HISTORY: Teresa Mcknight is a 73 y.o. with above-mentioned history of neutropenia and thrombocytopenia. She was admitted to [facility] from 06/20/2019-06/24/2019 for acute pancreatitis with necrosis. She presents to the clinic today for follow-up of her hospitalization.  She also had swelling of the left side of the neck and wanted to see me for that.  The swelling has subsided.  She continues to have abdominal discomfort and bloating sensation.  Her eating habits have not been good.  She is a vegan.  REVIEW OF SYSTEMS:   Constitutional: Denies fevers, chills or abnormal weight loss Eyes: Denies blurriness of vision Ears, nose, mouth, throat, and face: Denies mucositis or sore throat Respiratory: Denies cough, dyspnea or wheezes Cardiovascular: Denies palpitation, chest discomfort Gastrointestinal: Abdominal discomfort and pain which is much improved from before. Skin: Denies abnormal skin rashes Lymphatics: Denies new lymphadenopathy or easy bruising Neurological: Denies numbness, tingling or new weaknesses Behavioral/Psych: Mood is stable, no new changes  Extremities: No lower extremity edema Breast: denies any pain or lumps or nodules in either breasts All other systems were reviewed with the patient and are negative.  I have reviewed the past medical history, past surgical history, social history and family history with the patient and they are unchanged from previous note.  ALLERGIES:  is allergic to aspirin; dairy aid [lactase]; latex; and valtrex [valacyclovir hcl].  MEDICATIONS:  Current Outpatient Medications  Medication Sig Dispense Refill  . acyclovir (ZOVIRAX) 400 MG tablet Take 1 tablet (400  mg total) by mouth 3 (three) times daily. Prn for flare ups,start < 48 hrs after sx onset and for 5 days. (Patient taking differently: Take 200 mg by mouth daily. Prn for flare ups,start < 48 hrs after sx onset and for 5 days.) 30 tablet 1  . Ascorbic Acid (VITAMIN C PO) Take 500 mg by mouth at bedtime.     . Cholecalciferol (VITAMIN D3) 50 MCG (2000 UT) TABS Take 2,000 Units by mouth at bedtime.    Marland Kitchen VITAMIN B1-B12 PO Take 5,000 mg by mouth at bedtime.      No current facility-administered medications for this visit.     PHYSICAL EXAMINATION: ECOG PERFORMANCE STATUS: 1 - Symptomatic but completely ambulatory  There were no vitals filed for this visit. There were no vitals filed for this visit.  GENERAL: alert, no distress and comfortable SKIN: skin color, texture, turgor are normal, no rashes or significant lesions EYES: normal, Conjunctiva are pink and non-injected, sclera clear OROPHARYNX: no exudate, no erythema and lips, buccal mucosa, and tongue normal  NECK: supple, thyroid normal size, non-tender, without nodularity LYMPH: no palpable lymphadenopathy in the cervical, axillary or inguinal LUNGS: clear to auscultation and percussion with normal breathing effort HEART: regular rate & rhythm and no murmurs and no lower extremity edema ABDOMEN: abdomen soft, non-tender and normal bowel sounds MUSCULOSKELETAL: no cyanosis of digits and no clubbing  NEURO: alert & oriented x 3 with fluent speech, no focal motor/sensory deficits EXTREMITIES: No lower extremity edema  LABORATORY DATA:  I have reviewed the data as listed CMP Latest Ref Rng & Units 06/24/2019 06/23/2019 06/22/2019  Glucose 70 - 99 mg/dL 88 70 64(L)  BUN 8 - 23 mg/dL 7(L) 13 22  Creatinine 0.44 - 1.00 mg/dL 0.87 0.91 0.99  Sodium 135 - 145 mmol/L 140 140 140  Potassium 3.5 - 5.1 mmol/L 3.7 3.3(L) 3.9  Chloride 98 - 111 mmol/L 111 112(H) 109  CO2 22 - 32 mmol/L 21(L) 18(L) 21(L)  Calcium 8.9 - 10.3 mg/dL 8.1(L) 7.9(L)  8.0(L)  Total Protein 6.5 - 8.1 g/dL - 4.9(L) 5.0(L)  Total Bilirubin 0.3 - 1.2 mg/dL - 0.6 0.9  Alkaline Phos 38 - 126 U/L - 56 49  AST 15 - 41 U/L - 28 31  ALT 0 - 44 U/L - 17 16    Lab Results  Component Value Date   WBC 8.3 06/21/2019   HGB 12.6 06/21/2019   HCT 38.2 06/21/2019   MCV 96.0 06/21/2019   PLT 144 (L) 06/21/2019   NEUTROABS 1.6 (L) 11/19/2018    ASSESSMENT & PLAN:  Neutropenia (HCC) Mild neutropenia:  10/04/2018: WBC 3.4, ANC 1.4, platelet count 133, hemoglobin 12.1 11/19/2018: WBC 4, ANC 1.6, platelets 132, hemoglobin 13, few atypical lymphs ANA: Negative, F41 and folic acid were normal, TSH and thyroid function: Normal   Hospitalization for acute pancreatitis August 2020: Unclear etiology.  Labs done in the hospital show normalization of WBC count and platelet count of 144. We discussed previously that if the [place] drops below 1.0, we will need to do a bone marrow biopsy.  I encouraged her to take turmeric for inflammation. I also recommended that she see a primary care physician and I gave her a referral. Since there is no further issues with the white blood cell count of platelets I can see her on an as-needed basis.  No orders of the defined types were placed in this encounter.  The patient has a good understanding of the overall plan. she agrees with it. she will call with any problems that may develop before the next visit here.  Nicholas Lose, MD 06/27/2019  Julious Oka Dorshimer am acting as scribe for Dr. Nicholas Lose.  I have reviewed the above documentation for accuracy and completeness, and I agree with the above.

## 2019-06-27 ENCOUNTER — Other Ambulatory Visit: Payer: Self-pay

## 2019-06-27 ENCOUNTER — Inpatient Hospital Stay: Payer: Medicare Other | Attending: Hematology and Oncology | Admitting: Hematology and Oncology

## 2019-06-27 DIAGNOSIS — Z79899 Other long term (current) drug therapy: Secondary | ICD-10-CM | POA: Diagnosis not present

## 2019-06-27 DIAGNOSIS — D709 Neutropenia, unspecified: Secondary | ICD-10-CM | POA: Diagnosis present

## 2019-06-27 MED ORDER — FUROSEMIDE 20 MG PO TABS: 20.0000 mg | ORAL_TABLET | Freq: Every day | ORAL | 0 refills | Status: DC

## 2019-06-27 MED ORDER — VITAMIN C 500 MG PO TABS: 500.0000 mg | ORAL_TABLET | Freq: Every day | ORAL | Status: DC

## 2019-07-04 ENCOUNTER — Telehealth: Payer: Self-pay

## 2019-07-04 ENCOUNTER — Ambulatory Visit: Payer: Medicare Other | Admitting: Family Medicine

## 2019-07-04 NOTE — Telephone Encounter (Signed)
Copied from [place] [id number]. Topic: Appointment Scheduling - Transfer of Care >> Jul 04, 2019  3:26 PM Rayann Heman wrote: Pt is requesting to transfer FROM: [place]  Pt is requesting to transfer TO: banks  Reason for requested transfer: wants to see banks   Send CRM to patient's current PCP (transferring FROM).

## 2019-07-04 NOTE — Telephone Encounter (Signed)
Dr. Jordan - Please advise on pt's request to transfer care. Thanks! 

## 2019-07-05 NOTE — Telephone Encounter (Signed)
It is fine with me. ?Thanks ?

## 2019-07-05 NOTE — Telephone Encounter (Signed)
Patient would like to transfer to Dr. Volanda Napoleon.

## 2019-07-08 ENCOUNTER — Inpatient Hospital Stay: Payer: Medicare Other | Admitting: Family Medicine

## 2019-07-08 NOTE — Telephone Encounter (Signed)
Patient checking on the status of message mentioned below, informed patient will follow up.

## 2019-07-08 NOTE — Telephone Encounter (Signed)
ok 

## 2019-07-11 NOTE — Telephone Encounter (Signed)
Pt has an office visit scheduled for 08/08/2019 at 8.30 am for [facility] with Dr Volanda Napoleon

## 2019-08-08 ENCOUNTER — Encounter: Payer: Self-pay | Admitting: Family Medicine

## 2019-08-08 ENCOUNTER — Other Ambulatory Visit: Payer: Self-pay

## 2019-08-08 ENCOUNTER — Ambulatory Visit (INDEPENDENT_AMBULATORY_CARE_PROVIDER_SITE_OTHER): Payer: Medicare Other | Admitting: Family Medicine

## 2019-08-08 VITALS — BP 130/68 | HR 74 | Temp 98.0°F | Wt 118.0 lb

## 2019-08-08 DIAGNOSIS — K5900 Constipation, unspecified: Secondary | ICD-10-CM | POA: Insufficient documentation

## 2019-08-08 DIAGNOSIS — Z789 Other specified health status: Secondary | ICD-10-CM | POA: Insufficient documentation

## 2019-08-08 DIAGNOSIS — K8501 Idiopathic acute pancreatitis with uninfected necrosis: Secondary | ICD-10-CM | POA: Diagnosis not present

## 2019-08-08 DIAGNOSIS — R0989 Other specified symptoms and signs involving the circulatory and respiratory systems: Secondary | ICD-10-CM

## 2019-08-08 DIAGNOSIS — K5909 Other constipation: Secondary | ICD-10-CM | POA: Diagnosis not present

## 2019-08-08 LAB — COMPREHENSIVE METABOLIC PANEL
ALT: 11 U/L (ref 0–35)
AST: 22 U/L (ref 0–37)
Albumin: 4.2 g/dL (ref 3.5–5.2)
Alkaline Phosphatase: 108 U/L (ref 39–117)
BUN: 6 mg/dL (ref 6–23)
CO2: 30 mEq/L (ref 19–32)
Calcium: 9.9 mg/dL (ref 8.4–10.5)
Chloride: 104 mEq/L (ref 96–112)
Creatinine, Ser: 0.81 mg/dL (ref 0.40–1.20)
GFR: 84.17 mL/min (ref >60.00)
Glucose, Bld: 90 mg/dL (ref 70–99)
Potassium: 3.6 mEq/L (ref 3.5–5.1)
Sodium: 140 mEq/L (ref 135–145)
Total Bilirubin: 0.3 mg/dL (ref 0.2–1.2)
Total Protein: 7.1 g/dL (ref 6.0–8.3)

## 2019-08-08 LAB — CBC
HCT: 35.4 % — ABNORMAL LOW (ref 36.0–46.0)
Hemoglobin: 11.7 g/dL — ABNORMAL LOW (ref 12.0–15.0)
MCHC: 33.1 g/dL (ref 30.0–36.0)
MCV: 93.4 fl (ref 78.0–100.0)
Platelets: 123 10*3/uL — ABNORMAL LOW (ref 150.0–400.0)
RBC: 3.79 Mil/uL — ABNORMAL LOW (ref 3.87–5.11)
RDW: 14.6 % (ref 11.5–15.5)
WBC: 4 10*3/uL (ref 4.0–10.5)

## 2019-08-08 LAB — LIPASE: Lipase: 50 U/L (ref 11.0–59.0)

## 2019-08-08 NOTE — Patient Instructions (Addendum)
Peripheral artery disease, loss of difference in blood pressure readings between the right and left arm.  You can follow-up on this further with your cardiologist.  Continue to exercise, eat a healthy diet and decrease the amount of cholesterol you are eating to help improve this.  We will continue to monitor.  Chronic Constipation  Chronic constipation is a condition in which a person has three or fewer bowel movements a week, for three months or longer. This condition is especially common in older adults. The two main kinds of chronic constipation are secondary constipation and functional constipation. Secondary constipation results from another condition or a treatment. Functional constipation, also called primary or idiopathic constipation, is divided into three types:  Normal transit constipation. In this type, movement of stool through the colon (stool transit) occurs normally.  Slow transit constipation. In this type, stool moves slowly through the colon.  Outlet constipation or pelvic floor dysfunction. In this type, the nerves and muscles that empty the rectum do not work normally. What are the causes? Causes of secondary constipation may include:  Failing to drink enough fluid, eat enough food or fiber, or get physically active.  Pregnancy.  A tear in the anus (anal fissure).  Blockage in the bowel (bowel obstruction).  Narrowing of the bowel (bowel stricture).  Having a long-term medical condition, such as: ? Diabetes. ? Hypothyroidism. ? Multiple sclerosis. ? Parkinson disease. ? Stroke. ? Spinal cord injury. ? Dementia. ? Colon cancer. ? Inflammatory bowel disease (IBD). ? Iron-deficiency anemia. ? Outward collapse of the rectum (rectal prolapse). ? Hemorrhoids.  Taking certain medicines, including: ? Narcotics. These are a certain type of prescription pain medicine. ? Antacids. ? Iron supplements. ? Water pills (diuretics). ? Certain blood pressure medicines.  ? Anti-seizure medicines. ? Antidepressants. ? Medicines for Parkinson disease. The cause of functional constipation is not known, but some conditions are associated with it. These conditions include:  Stress.  Problems in the nerves and muscles that control stool transit.  Weak or impaired pelvic floor muscles. What increases the risk? You may be at higher risk for chronic constipation if you:  Are older than age 40.  Are female.  Live in a long-term care facility.  Do not get much exercise or physical activity (have a sedentary lifestyle).  Do not drink enough fluids.  Do not eat enough food, especially fiber.  Have a long-term disease.  Have a mental health disorder or eating disorder.  Take many medicines. What are the signs or symptoms? The main symptom of chronic constipation is having three or fewer bowel movements a week for several weeks. Other signs and symptoms may vary from person to person. These include:  Pushing hard (straining) to pass stool.  Painful bowel movements.  Having hard or lumpy stools.  Having lower belly discomfort, such as cramps or bloating.  Being unable to have a bowel movement when you feel the urge.  Feeling like you still need to pass stool after a bowel movement.  Feeling that you have something in your rectum that is blocking or preventing bowel movements.  Seeing blood on the toilet paper or in your stool.  Worsening confusion (in older adults). How is this diagnosed? This condition may be diagnosed based on:  Symptoms and medical history. You will be asked about your symptoms, lifestyle, diet, and any medicines that you are taking.  Physical exam. ? Your belly (abdomen) will be examined. ? A digital rectal exam may be done. For this exam,  a health care provider places a lubricated, gloved finger into the rectum.  Other tests to check for any underlying causes of your constipation. These may be ordered if you have  bleeding in your rectum, weight loss, or a family history of colon cancer. In these cases, you may have: ? Imaging studies of the colon. These may include X-ray, ultrasound, or CT scan. ? Blood tests. ? A procedure to examine the inside of your colon (colonoscopy). ? More specialized tests to check:  Whether your anal sphincter works well. This is a ring-shaped muscle that controls the closing of the anus.  How well food moves through your colon. ? Tests to measure the nerve signal in your pelvic floor muscles (electromyography). How is this treated? Treatment for chronic constipation depends on the cause. Most often, treatment starts with:  Being more active and getting regular exercise.  Drinking more fluids.  Adding fiber to your diet. Sources of fiber include fruits, vegetables, whole grains, and fiber supplements.  Using medicines such as stool softeners or medicines that increase contractions in your digestive system (pro-motility agents).  Training your pelvic muscles with biofeedback.  Surgery, if there is obstruction. Treatment for secondary chronic constipation depends on the underlying condition. You may need to:  Stop or change some medicines if they cause constipation.  Use a fiber supplement (bulk laxative) or stool softener.  Use prescription laxative. This works by [employer] into your colon (osmotic laxative). You may also need to see a specialist who treats conditions of the digestive system (gastroenterologist). Follow these instructions at home:   Take over-the-counter and prescription medicines only as told by your health care provider.  If you are taking a laxative, take it as told by your health care provider.  Eat a balanced diet that includes enough fiber. Ask your health care provider to recommend a diet that is right for you.  Drink clear fluids, especially water. Avoid drinking alcohol, caffeine, and soda.  Drink enough fluid to keep your  urine pale yellow.  Get some physical activity every day. Ask your health care provider what physical activities are safe for you.  Get colon cancer screenings as told by your health care provider.  Keep all follow-up visits as told by your health care provider. This is important. Contact a health care provider if:  You are having three or fewer bowel movements a week.  Your stools are hard or lumpy.  You notice blood on the toilet paper or in your stool after you have a bowel movement.  You have unexplained weight loss.  You have rectum (rectal) pain.  You have stool leakage.  You experience nausea or vomiting. Get help right away if:  You have rectal bleeding or you pass blood clots.  You have severe rectal pain.  You have body tissue that pushes out (protrudes) from your anus.  You have severe pain or bloating (distension) in your abdomen.  You have vomiting that you cannot control. Summary  Chronic constipation is a condition in which a person has three or fewer bowel movements a week, for three months or longer.  You may have a higher risk for this condition if you are an older adult, or if you do not drink enough water or get enough physical activity (are sedentary).  Treatment for this condition depends on the cause. Most treatments for chronic constipation include adding fiber to your diet, drinking more fluids, and getting more physical activity. You may also need to treat  any underlying medical conditions or stop or change certain medicines if they cause constipation.  If lifestyle changes do not relieve constipation, your health care provider may recommend taking a laxative. This information is not intended to replace advice given to you by your health care provider. Make sure you discuss any questions you have with your health care provider. Document Released: 05/30/2017 Document Revised: 10/13/2017 Document Reviewed: 07/18/2017 Elsevier Patient Education   2020 [employer]  Acute Pancreatitis  The pancreas is a gland that is located behind the stomach on the left side of the abdomen. It produces enzymes that help to digest food. The pancreas also releases the hormones glucagon and insulin, which help to regulate blood sugar. Acute pancreatitis happens when inflammation of the pancreas suddenly occurs and the pancreas becomes irritated and swollen. Most acute attacks last a few days and cause serious problems. Some people become dehydrated and develop low blood pressure. In severe cases, bleeding in the abdomen can lead to shock and can be life-threatening. The lungs, heart, and kidneys may fail. What are the causes? This condition may be caused by:  Alcohol abuse.  Drug abuse.  Gallstones or other conditions that can block the tube that drains the pancreas (pancreatic duct).  A tumor in the pancreas. Other causes include:  Certain medicines.  Exposure to certain chemicals.  Diabetes.  An infection in the pancreas.  Damage caused by an accident (trauma).  The poison (venom) from a scorpion bite.  Abdominal surgery.  Autoimmune pancreatitis. This is when the body's disease-fighting (immune) system attacks the pancreas.  Genes that are passed from parent to child (inherited). In some cases, the cause of this condition is not known. What are the signs or symptoms? Symptoms of this condition include:  Pain in the upper abdomen that may radiate to the back. Pain may be severe.  Tenderness and swelling of the abdomen.  Nausea and vomiting.  Fever. How is this diagnosed? This condition may be diagnosed based on:  A physical exam.  Blood tests.  Imaging tests, such as X-rays, CT or MRI scans, or an ultrasound of the abdomen. How is this treated? Treatment for this condition usually requires a stay in the hospital. Treatment for this condition may include:  Pain medicine.  Fluid replacement through an IV.  Placing a  tube in the stomach to remove stomach contents and to control vomiting (NG tube, or nasogastric tube).  Not eating for 3-4 days. This gives the pancreas a rest, because enzymes are not being produced that can cause further damage.  Antibiotic medicines, if your condition is caused by an infection.  Treating any underlying conditions that may be the cause.  Steroid medicines, if your condition is caused by your immune system attacking your body's own tissues (autoimmune disease).  Surgery on the pancreas or gallbladder. Follow these instructions at home: Eating and drinking   Follow instructions from your health care provider about diet. This may involve avoiding alcohol and decreasing the amount of fat in your diet.  Eat smaller, more frequent meals. This reduces the amount of digestive fluids that the pancreas produces.  Drink enough fluid to keep your urine pale yellow.  Do not drink alcohol if it caused your condition. General instructions  Take over-the-counter and prescription medicines only as told by your health care provider.  Do not drive or use heavy machinery while taking prescription pain medicine.  Ask your health care provider if the medicine prescribed to you can cause constipation.  You may need to take steps to prevent or treat constipation, such as: ? Take an over-the-counter or prescription medicine for constipation. ? Eat foods that are high in fiber such as whole grains and beans. ? Limit foods that are high in fat and processed sugars, such as fried or sweet foods.  Do not use any products that contain nicotine or tobacco, such as cigarettes, e-cigarettes, and chewing tobacco. If you need help quitting, ask your health care provider.  Get plenty of rest.  If directed, check your blood sugar at home as told by your health care provider.  Keep all follow-up visits as told by your health care provider. This is important. Contact a health care provider if you:   Do not recover as quickly as expected.  Develop new or worsening symptoms.  Have persistent pain, weakness, or nausea.  Recover and then have another episode of pain.  Have a fever. Get help right away if:  You cannot eat or keep fluids down.  Your pain becomes severe.  Your skin or the white part of your eyes turns yellow (jaundice).  You have sudden swelling in your abdomen.  You vomit.  You feel dizzy or you faint.  Your blood sugar is high (over 300 mg/dL). Summary  Acute pancreatitis happens when inflammation of the pancreas suddenly occurs and the pancreas becomes irritated and swollen.  This condition is typically caused by alcohol abuse, drug abuse, or gallstones.  Treatment for this condition usually requires a stay in the hospital. This information is not intended to replace advice given to you by your health care provider. Make sure you discuss any questions you have with your health care provider. Document Released: 10/31/2005 Document Revised: 08/20/2018 Document Reviewed: 05/07/2018 Elsevier Patient Education  2020 [employer].

## 2019-08-08 NOTE — Progress Notes (Signed)
Subjective:    Patient ID: Teresa Mcknight, female    DOB: January 05, 1946, 73 y.o.   MRN: [id number]  No chief complaint on file.   HPI Patient was seen today for TOC and HFpEF.  Patient admitted to [facility] on 8/6-8/08/2019 for idiopathic pancreatitis with necrosis.  Lipase was 275 on admission and CT abdomen pelvis with extensive peri-pancreatic stranding and fluid enhancement within the body/tail suggestive of early necrosis.  No gallstones or gallbladder wall thickening noted.  Interval f/u CT advised.  GI, Dr. Mikey College was consulted via phone.  Pt given IVFs, diet advanced as tolerated.  Pt states she has been doing better since d/c from the hospital.  Tolerating po.  Pt denies abdominal pain, bloating, or emesis.  Pt has yet to follow-up with GI.  Pt requesting new referral to a different GI provider as she is not seen in the hospital.  Pt endorses h/o lifelong constipation.  Using laxative daily x 2 yrs.  Pt does not recall a/w specific foods, stress, etc.  OTC miralax and fiber supplements caused flatus, but no BM.  Eating increased fiber and vegetables as she is vegan.  Pt denies tobacco, EtOH, and drug use.  Pt endorses h/o genital HSV, takes acyclovir as needed.  Followed by OB/Gyn.  Pt seen by Heme/Onc for h/o neutropenia.  Also seen by Cardiology for palpitatons, which have improved.  Past Medical History:  Diagnosis Date  . Heart murmur   . Palpitations 11/21/2018    Allergies  Allergen Reactions  . Aspirin Other (See Comments)    Sore stomach  . Dairy Aid [Lactase]     Causes Mucus  . Latex Itching  . Valtrex [Valacyclovir Hcl] Nausea And Vomiting    ROS General: Denies fever, chills, night sweats, changes in weight, changes in appetite HEENT: Denies headaches, ear pain, changes in vision, rhinorrhea, sore throat CV: Denies CP, palpitations, SOB, orthopnea Pulm: Denies SOB, cough, wheezing GI: Denies abdominal pain, nausea, vomiting, diarrhea, constipation GU: Denies dysuria,  hematuria, frequency, vaginal discharge Msk: Denies muscle cramps, joint pains Neuro: Denies weakness, numbness, tingling Skin: Denies rashes, bruising Psych: Denies depression, anxiety, hallucinations      Objective:    Blood pressure 130/68, pulse 74, temperature 98 F (36.7 C), weight 118 lb (53.5 kg), SpO2 98 %.  BP in R arm 110/70.  BP in L arm  130/70   Gen. Pleasant, well-nourished, in no distress, normal affect   HEENT: [place]/AT, face symmetric, no scleral icterus, PERRLA, EOMI, nares patent without drainage Lungs: no accessory muscle use, CTAB, no wheezes or rales Cardiovascular: RRR, no m/r/g, no peripheral edema Abdomen: BS present, soft, NT/ND, no hepatosplenomegaly. Musculoskeletal: No deformities, no cyanosis or clubbing, normal tone Neuro:  A&Ox3, CN II-XII intact, normal gait Skin:  Warm, no lesions/ rash   Wt Readings from Last 3 Encounters:  08/08/19 118 lb (53.5 kg)  06/27/19 124 lb 3 oz (56.3 kg)  06/24/19 109 lb 2 oz (49.5 kg)    Lab Results  Component Value Date   WBC 8.3 06/21/2019   HGB 12.6 06/21/2019   HCT 38.2 06/21/2019   PLT 144 (L) 06/21/2019   GLUCOSE 88 06/24/2019   TRIG 49 06/20/2019   ALT 17 06/23/2019   AST 28 06/23/2019   NA 140 06/24/2019   K 3.7 06/24/2019   CL 111 06/24/2019   CREATININE 0.87 06/24/2019   BUN 7 (L) 06/24/2019   CO2 21 (L) 06/24/2019   TSH 2.030 11/21/2018  Assessment/Plan:  Idiopathic acute pancreatitis with uninfected necrosis  -pt advised to f/u with GI.  Will place new referral per pt request. -given handout -given precautions -f/u CT abd/pelvis advised.  Will defer to GI. - Plan: Comprehensive metabolic panel, Lipase, CBC (no diff), Ambulatory referral to Gastroenterology  Chronic constipation -advised against the daily use of laxatives. -discussed other options.  Pt prefers natural methods. -given handout  Vegan -continue healthy lifestyle choices -pt inquires about egg/diary free influenza  vaccine.  Pt considering receiving the immunization.  Will contact office to schedule if she decides.  Unequal blood pressure impaired extremities -Asymptomatic -Right arm BP 110/70.  Left arm BP 130/70 -discussed various causes including PAD, stenosis,etc -consider u/s -f/u with Cardiology prn  F/u prn in the next few months  Grier Mitts, MD

## 2019-08-14 ENCOUNTER — Telehealth: Payer: Self-pay | Admitting: *Deleted

## 2019-08-14 ENCOUNTER — Other Ambulatory Visit: Payer: Self-pay | Admitting: Gastroenterology

## 2019-08-14 DIAGNOSIS — K8501 Idiopathic acute pancreatitis with uninfected necrosis: Secondary | ICD-10-CM

## 2019-08-14 NOTE — Telephone Encounter (Signed)
Spoke with pt verbalized understanding of her lab results and recommendations. Pt is scheduled for a f/u in December

## 2019-08-14 NOTE — Telephone Encounter (Signed)
Copied from [place] [id number]. Topic: General - Inquiry >> Aug 14, 2019  9:57 AM Virl Axe D wrote: Reason for CRM: Pt has questions regarding recent lab results from last week. Requesting CB. Please advise

## 2019-08-20 ENCOUNTER — Telehealth: Payer: Self-pay

## 2019-08-20 NOTE — Telephone Encounter (Signed)
ROI fax to [facility]  for records °

## 2019-09-02 NOTE — Telephone Encounter (Signed)
Rec'd from [place] forwarded 13 pages to [employer]

## 2019-09-04 ENCOUNTER — Telehealth: Payer: Self-pay | Admitting: Gastroenterology

## 2019-09-04 NOTE — Telephone Encounter (Signed)
Dr. Tarri Glenn, pt requested you to evaluate idiopathic acute pancreatitis.  Pt has GI hx at [facility]--Colonoscopy was done 04/09/19 by Dr. Oletta Lamas.  Records will be sent to you for review.  Please advise whether you will accept this patient.

## 2019-09-06 ENCOUNTER — Telehealth: Payer: Self-pay | Admitting: Gastroenterology

## 2019-09-10 NOTE — Telephone Encounter (Signed)
Pt stated that Dr. Oletta Lamas is retiring/has retired.  She was diagnosed with pancreatitis in 06/2019 and was advised to follow up with GI.  She reported that she is currently having abdominal swelling and pain and would like to establish care with a female GI MD.

## 2019-09-23 ENCOUNTER — Encounter: Payer: Self-pay | Admitting: Gastroenterology

## 2019-10-29 ENCOUNTER — Ambulatory Visit: Payer: Medicare Other | Admitting: Gastroenterology

## 2019-11-13 ENCOUNTER — Ambulatory Visit: Payer: Medicare Other | Admitting: Family Medicine

## 2019-11-15 HISTORY — PX: KNEE ARTHROSCOPY: SHX127

## 2019-11-29 ENCOUNTER — Telehealth: Payer: Self-pay | Admitting: Gastroenterology

## 2019-11-29 NOTE — Telephone Encounter (Signed)
Patient is calling wanting to know if Dr. Orvan Falconer would be examining her since she's a new patient or can the appt be a virtual.

## 2019-12-02 ENCOUNTER — Ambulatory Visit: Payer: Medicare Other | Admitting: Hematology and Oncology

## 2019-12-02 ENCOUNTER — Other Ambulatory Visit: Payer: Medicare Other

## 2019-12-02 NOTE — Telephone Encounter (Signed)
Spoke with patient she states that she was not sure if Dr Orvan Falconer would examine her during her upcoming appointment. She states she did not want to be just talked to or asked questions. She is anxious to find the root of her problems. She thought she was going to have a virtual visit and became very upset. I informed her that she is welcome to come into the office and since she is a new patient more than likely she will be sent for further testing such as labs, imaging or possibly a procedure. She verbalized understanding and states she will think about it and call back to reschedule her appointment.

## 2019-12-03 ENCOUNTER — Ambulatory Visit: Payer: Medicare Other | Admitting: Gastroenterology

## 2019-12-10 ENCOUNTER — Telehealth: Payer: Self-pay | Admitting: *Deleted

## 2020-01-05 ENCOUNTER — Ambulatory Visit: Payer: Medicare PPO | Attending: Internal Medicine

## 2020-01-05 ENCOUNTER — Ambulatory Visit: Payer: Medicare PPO

## 2020-01-05 DIAGNOSIS — Z23 Encounter for immunization: Secondary | ICD-10-CM | POA: Insufficient documentation

## 2020-01-05 NOTE — Progress Notes (Signed)
   Covid-19 Vaccination Clinic  Name:  Teresa Mcknight    MRN: [id number] DOB: [birth year]  01/05/2020  Ms. Teresa Mcknight was observed post Covid-19 immunization for 15 minutes without incidence. She was provided with Vaccine Information Sheet and instruction to access the V-Safe system.   Ms. Teresa Mcknight was instructed to call 911 with any severe reactions post vaccine: Marland Kitchen Difficulty breathing  . Swelling of your face and throat  . A fast heartbeat  . A bad rash all over your body  . Dizziness and weakness    Immunizations Administered    Name Date Dose VIS Date Route   Pfizer COVID-19 Vaccine 01/05/2020  8:44 AM 0.3 mL 10/25/2019 Intramuscular   Manufacturer: [employer], [employer]   Lot: [id number]   NDC: [address]-2

## 2020-01-07 NOTE — Telephone Encounter (Signed)
Error

## 2020-01-10 ENCOUNTER — Ambulatory Visit: Payer: Medicare PPO

## 2020-01-14 ENCOUNTER — Other Ambulatory Visit: Payer: Self-pay

## 2020-01-15 ENCOUNTER — Ambulatory Visit (INDEPENDENT_AMBULATORY_CARE_PROVIDER_SITE_OTHER): Payer: Medicare PPO | Admitting: Family Medicine

## 2020-01-15 ENCOUNTER — Encounter: Payer: Self-pay | Admitting: Family Medicine

## 2020-01-15 VITALS — BP 122/61 | HR 84 | Temp 97.7°F | Wt 116.0 lb

## 2020-01-15 DIAGNOSIS — K5909 Other constipation: Secondary | ICD-10-CM | POA: Diagnosis not present

## 2020-01-15 DIAGNOSIS — R1013 Epigastric pain: Secondary | ICD-10-CM | POA: Diagnosis not present

## 2020-01-15 DIAGNOSIS — Z8719 Personal history of other diseases of the digestive system: Secondary | ICD-10-CM

## 2020-01-15 MED ORDER — SENNA 8.6 MG PO TABS: 1.0000 | ORAL_TABLET | Freq: Every day | ORAL | 0 refills | Status: DC | PRN

## 2020-01-15 MED ORDER — DOCUSATE SODIUM 100 MG PO CAPS: 100.0000 mg | ORAL_CAPSULE | Freq: Two times a day (BID) | ORAL | 0 refills | Status: DC

## 2020-01-15 NOTE — Progress Notes (Signed)
Subjective:    Patient ID: Teresa Mcknight, female    DOB: 09-02-1946, 74 y.o.   MRN: [id number]  No chief complaint on file.   HPI Patient was seen today for f/u.  Pt hospitalized 07/2019 for acute idopathic pancreatitis.  Pt notes a soreness/burning in epigastric area and occasional nausea.  Pt denies vomiting, back pain, EtOH use, new medications or supplements.  Pt has yet to schedule GI f/u.  Also with chronic constipation.   Last BM 4 days ago, noted as hard pellets.  Miralax causes bloating.  Psyllium powder causes bloating and gas.   OTC laxative every other day helps some.  Drinking mostly water.  Pt is a vegan.  Denies changes in stools with stress.  Had a colonoscopy last yr.  Past Medical History:  Diagnosis Date  . Heart murmur   . Palpitations 11/21/2018    Allergies  Allergen Reactions  . Aspirin Other (See Comments)    Sore stomach  . Dairy Aid [Lactase]     Causes Mucus  . Latex Itching  . Valtrex [Valacyclovir Hcl] Nausea And Vomiting    ROS General: Denies fever, chills, night sweats, changes in weight, changes in appetite HEENT: Denies headaches, ear pain, changes in vision, rhinorrhea, sore throat CV: Denies CP, palpitations, SOB, orthopnea Pulm: Denies SOB, cough, wheezing GI: Denies vomiting, diarrhea   +constipation, abdominal pain, nausea, bloating GU: Denies dysuria, hematuria, frequency, vaginal discharge Msk: Denies muscle cramps, joint pains Neuro: Denies weakness, numbness, tingling Skin: Denies rashes, bruising Psych: Denies depression, anxiety, hallucinations     Objective:    Blood pressure 122/61, pulse 84, temperature 97.7 F (36.5 C), temperature source Temporal, weight 116 lb (52.6 kg), SpO2 97 %.   Gen. Pleasant, well-nourished, in no distress, normal affect   HEENT: [place]/AT, face symmetric, no scleral icterus, PERRLA, EOMI, nares patent without drainage Lungs: no accessory muscle use, CTAB, no wheezes or rales Cardiovascular: RRR, no m/r/g,  no peripheral edema Abdomen: BS present, soft, mild TTP of epigastric area, ND, negative murphy's, no hepatosplenomegaly.   Neuro:  A&Ox3, CN II-XII intact, normal gait Skin:  Warm, no lesions/ rash   Wt Readings from Last 3 Encounters:  01/15/20 116 lb (52.6 kg)  08/08/19 118 lb (53.5 kg)  06/27/19 124 lb 3 oz (56.3 kg)    Lab Results  Component Value Date   WBC 4.0 08/08/2019   HGB 11.7 (L) 08/08/2019   HCT 35.4 (L) 08/08/2019   PLT 123.0 (L) 08/08/2019   GLUCOSE 90 08/08/2019   TRIG 49 06/20/2019   ALT 11 08/08/2019   AST 22 08/08/2019   NA 140 08/08/2019   K 3.6 08/08/2019   CL 104 08/08/2019   CREATININE 0.81 08/08/2019   BUN 6 08/08/2019   CO2 30 08/08/2019   TSH 2.030 11/21/2018    Assessment/Plan:  Epigastric pain -discussed various causes including pancreatitis, gastric ulcer, gallstones -discussed obtaining labs, however pt declines.  Wishes to wait until sees GI. -Pt encouraged to f/u with GI.   -given precautions  History of pancreatitis -thought idopathic  Chronic constipation -encouraged to keep food diary  -f/u with GI - Plan: docusate sodium (COLACE) 100 MG capsule, senna (SENOKOT) 8.6 MG TABS tablet  F/u prn  Abbe Amsterdam, MD

## 2020-01-15 NOTE — Patient Instructions (Addendum)
Chronic Constipation  Chronic constipation is a condition in which a person has three or fewer bowel movements a week, for three months or longer. This condition is especially common in older adults. The two main kinds of chronic constipation are secondary constipation and functional constipation. Secondary constipation results from another condition or a treatment. Functional constipation, also called primary or idiopathic constipation, is divided into three types:  Normal transit constipation. In this type, movement of stool through the colon (stool transit) occurs normally.  Slow transit constipation. In this type, stool moves slowly through the colon.  Outlet constipation or pelvic floor dysfunction. In this type, the nerves and muscles that empty the rectum do not work normally. What are the causes? Causes of secondary constipation may include:  Failing to drink enough fluid, eat enough food or fiber, or get physically active.  Pregnancy.  A tear in the anus (anal fissure).  Blockage in the bowel (bowel obstruction).  Narrowing of the bowel (bowel stricture).  Having a long-term medical condition, such as: ? Diabetes. ? Hypothyroidism. ? Multiple sclerosis. ? Parkinson disease. ? Stroke. ? Spinal cord injury. ? Dementia. ? Colon cancer. ? Inflammatory bowel disease (IBD). ? Iron-deficiency anemia. ? Outward collapse of the rectum (rectal prolapse). ? Hemorrhoids.  Taking certain medicines, including: ? Narcotics. These are a certain type of prescription pain medicine. ? Antacids. ? Iron supplements. ? Water pills (diuretics). ? Certain blood pressure medicines. ? Anti-seizure medicines. ? Antidepressants. ? Medicines for Parkinson disease. The cause of functional constipation is not known, but some conditions are associated with it. These conditions include:  Stress.  Problems in the nerves and muscles that control stool transit.  Weak or impaired pelvic  floor muscles. What increases the risk? You may be at higher risk for chronic constipation if you:  Are older than age 74.  Are female.  Live in a long-term care facility.  Do not get much exercise or physical activity (have a sedentary lifestyle).  Do not drink enough fluids.  Do not eat enough food, especially fiber.  Have a long-term disease.  Have a mental health disorder or eating disorder.  Take many medicines. What are the signs or symptoms? The main symptom of chronic constipation is having three or fewer bowel movements a week for several weeks. Other signs and symptoms may vary from person to person. These include:  Pushing hard (straining) to pass stool.  Painful bowel movements.  Having hard or lumpy stools.  Having lower belly discomfort, such as cramps or bloating.  Being unable to have a bowel movement when you feel the urge.  Feeling like you still need to pass stool after a bowel movement.  Feeling that you have something in your rectum that is blocking or preventing bowel movements.  Seeing blood on the toilet paper or in your stool.  Worsening confusion (in older adults). How is this diagnosed? This condition may be diagnosed based on:  Symptoms and medical history. You will be asked about your symptoms, lifestyle, diet, and any medicines that you are taking.  Physical exam. ? Your belly (abdomen) will be examined. ? A digital rectal exam may be done. For this exam, a health care provider places a lubricated, gloved finger into the rectum.  Other tests to check for any underlying causes of your constipation. These may be ordered if you have bleeding in your rectum, weight loss, or a family history of colon cancer. In these cases, you may have: ? Imaging studies  of the colon. These may include X-ray, ultrasound, or CT scan. ? Blood tests. ? A procedure to examine the inside of your colon (colonoscopy). ? More specialized tests to  check:  Whether your anal sphincter works well. This is a ring-shaped muscle that controls the closing of the anus.  How well food moves through your colon. ? Tests to measure the nerve signal in your pelvic floor muscles (electromyography). How is this treated? Treatment for chronic constipation depends on the cause. Most often, treatment starts with:  Being more active and getting regular exercise.  Drinking more fluids.  Adding fiber to your diet. Sources of fiber include fruits, vegetables, whole grains, and fiber supplements.  Using medicines such as stool softeners or medicines that increase contractions in your digestive system (pro-motility agents).  Training your pelvic muscles with biofeedback.  Surgery, if there is obstruction. Treatment for secondary chronic constipation depends on the underlying condition. You may need to:  Stop or change some medicines if they cause constipation.  Use a fiber supplement (bulk laxative) or stool softener.  Use prescription laxative. This works by [employer] into your colon (osmotic laxative). You may also need to see a specialist who treats conditions of the digestive system (gastroenterologist). Follow these instructions at home:   Take over-the-counter and prescription medicines only as told by your health care provider.  If you are taking a laxative, take it as told by your health care provider.  Eat a balanced diet that includes enough fiber. Ask your health care provider to recommend a diet that is right for you.  Drink clear fluids, especially water. Avoid drinking alcohol, caffeine, and soda.  Drink enough fluid to keep your urine pale yellow.  Get some physical activity every day. Ask your health care provider what physical activities are safe for you.  Get colon cancer screenings as told by your health care provider.  Keep all follow-up visits as told by your health care provider. This is important. Contact a  health care provider if:  You are having three or fewer bowel movements a week.  Your stools are hard or lumpy.  You notice blood on the toilet paper or in your stool after you have a bowel movement.  You have unexplained weight loss.  You have rectum (rectal) pain.  You have stool leakage.  You experience nausea or vomiting. Get help right away if:  You have rectal bleeding or you pass blood clots.  You have severe rectal pain.  You have body tissue that pushes out (protrudes) from your anus.  You have severe pain or bloating (distension) in your abdomen.  You have vomiting that you cannot control. Summary  Chronic constipation is a condition in which a person has three or fewer bowel movements a week, for three months or longer.  You may have a higher risk for this condition if you are an older adult, or if you do not drink enough water or get enough physical activity (are sedentary).  Treatment for this condition depends on the cause. Most treatments for chronic constipation include adding fiber to your diet, drinking more fluids, and getting more physical activity. You may also need to treat any underlying medical conditions or stop or change certain medicines if they cause constipation.  If lifestyle changes do not relieve constipation, your health care provider may recommend taking a laxative. This information is not intended to replace advice given to you by your health care provider. Make sure you discuss any questions  you have with your health care provider. Document Revised: 10/13/2017 Document Reviewed: 07/18/2017 Elsevier Patient Education  [place].  Chronic Pancreatitis  Chronic pancreatitis is long-lasting inflammation and scarring of the pancreas. The pancreas is a gland that is located behind the stomach. It makes enzymes that help to digest food. The pancreas also releases hormones called glucagon and insulin, which help regulate blood sugar  (glucose). Damage to the pancreas may affect digestion, cause pain in the upper abdomen and back, and cause diabetes. Inflammation can also irritate other organs in the abdomen near the pancreas. At first, pancreatitis may be sudden (acute). If you have several or prolonged episodes of acute pancreatitis, the condition can turn into chronic pancreatitis. What are the causes? The most common cause of this condition is alcohol abuse. Other causes include:  High (elevated) levels of triglycerides in the blood (hypertriglyceridemia).  Gallstones or other conditions that can block the tube that drains the pancreas (pancreatic duct).  Pancreatic cancer.  Cystic fibrosis.  Too much calcium in the blood (hypercalcemia), which may be caused by an overactive parathyroid gland (hyperparathyroidism).  Certain medicines.  Injury to the pancreas.  Infection.  Autoimmune pancreatitis. This is when the body's disease-fighting (immune) system attacks the pancreas.  Genes that are passed from parent to child (inherited). In some cases, the cause may not be known. What increases the risk? This condition is more likely to develop in:  Men.  People who are 62-31 years old.  People who have a family history of pancreatitis.  People who smoke tobacco.  People who drink large amounts of alcohol over a long period of time. What are the signs or symptoms? Symptoms of this condition may include:  Pain in the abdomen or upper back. Pain may get worse after eating.  Nausea and vomiting.  Fever.  Weight loss.  A change in the color and consistency of bowel movements, such as stools that are oily, fatty, or clay-colored. How is this diagnosed? This condition is diagnosed based on your symptoms, your medical history, and a physical exam. You may have tests, such as:  Blood tests.  Stool samples.  Biopsy of the pancreas. This is the removal of a small amount of pancreas tissue to be tested in  a lab.  Imaging tests, such as: ? X-rays. ? CT scan. ? MRI. ? Ultrasound. How is this treated? You may need to be treated at a hospital. Treatment may involve:  Resting the pancreas. You may need to stop eating and drinking for a few days to give your pancreas time to recover. During this time, you will be given IV fluids to keep you hydrated.  Controlling pain. You may be given pain medicines by mouth (orally) or as injections.  Improving digestion. You may be given: ? Medicines to replace your pancreatic enzymes. ? Vitamin supplements. ? A specific diet to follow. You may work with a diet and nutrition specialist (dietitian) to make an eating plan.  Surgery to: ? Clear the pancreatic ducts of any blockages, such as gallstones. ? Remove any fluid or damaged tissue from the pancreas. Other treatments may include:  Preventing diabetes. Your health care provider may recommend that you: ? Get regular screening tests for diabetes. ? Monitor your blood glucose regularly.  Lifestyle changes, such as stopping alcohol use.  Steroid medicines, if your condition is caused by your immune system attacking your body's own tissues (autoimmune disease). Follow these instructions at home: Eating and drinking  Do not drink alcohol. If you need help quitting, ask your health care provider.  Follow a diet as told by your health care provider or dietitian, if this applies. This may include: ? Limiting how much fat you eat. ? Eating smaller meals more often. ? Avoiding caffeine.  Drink enough fluid to keep your urine pale yellow. General instructions  Take over-the-counter and prescription medicines only as told by your health care provider. These include vitamin supplements.  Do not drive or use heavy machinery while taking prescription pain medicine.  If you are taking prescription pain medicine, take actions to prevent or treat constipation. Your health care provider may  recommend that you: ? Take an over-the-counter or prescription medicine for constipation. ? Eat foods that are high in fiber such as whole grains and beans. ? Limit foods that are high in fat and processed sugars, such as fried or sweet foods.  Do not use any products that contain nicotine or tobacco, such as cigarettes and e-cigarettes. If you need help quitting, ask your health care provider.  If recommended by your health care provider, monitor your blood glucose at home.  Keep all follow-up visits as told by your health care provider. This is important. Contact a health care provider if:  You have pain that does not get better with medicine.  You have a fever.  You have sudden weight loss. Get help right away if:  Your pain suddenly gets worse.  You have sudden swelling in your abdomen.  You start to vomit often.  You vomit blood.  You have diarrhea that does not go away.  You have blood in your stool.  You become confused or you have trouble thinking clearly. Summary  Chronic pancreatitis is long-lasting inflammation and scarring of the pancreas. Damage to the pancreas may affect digestion, cause pain in the upper abdomen and back, and cause diabetes. Inflammation can also irritate other organs in the abdomen near the pancreas.  Common causes of this condition are alcohol abuse, gallstones, high (elevated) levels of triglycerides, and certain medicines.  This condition is sometimes treated at a hospital and may involve resting the pancreas, controlling pain, replacing enzymes, and avoiding alcohol. This information is not intended to replace advice given to you by your health care provider. Make sure you discuss any questions you have with your health care provider. Document Revised: 06/06/2019 Document Reviewed: 06/30/2017 Elsevier Patient Education  2020 [employer].

## 2020-01-28 ENCOUNTER — Ambulatory Visit: Payer: Medicare PPO | Attending: Internal Medicine

## 2020-01-28 DIAGNOSIS — Z23 Encounter for immunization: Secondary | ICD-10-CM

## 2020-01-28 NOTE — Progress Notes (Signed)
   Covid-19 Vaccination Clinic  Name:  Christell Steinmiller    MRN: [id number] DOB: 1946-03-16  01/28/2020  Ms. Soltero was observed post Covid-19 immunization for 15 minutes without incident. She was provided with Vaccine Information Sheet and instruction to access the V-Safe system.   Ms. Hutto was instructed to call 911 with any severe reactions post vaccine: Marland Kitchen Difficulty breathing  . Swelling of face and throat  . A fast heartbeat  . A bad rash all over body  . Dizziness and weakness   Immunizations Administered    Name Date Dose VIS Date Route   Pfizer COVID-19 Vaccine 01/28/2020  3:10 PM 0.3 mL 10/25/2019 Intramuscular   Manufacturer: [employer], [employer]   Lot: [id number]   NDC: [address]-7

## 2020-02-13 ENCOUNTER — Encounter: Payer: Self-pay | Admitting: Gastroenterology

## 2020-02-13 ENCOUNTER — Other Ambulatory Visit: Payer: Self-pay

## 2020-02-13 ENCOUNTER — Other Ambulatory Visit (INDEPENDENT_AMBULATORY_CARE_PROVIDER_SITE_OTHER): Payer: Medicare PPO

## 2020-02-13 ENCOUNTER — Ambulatory Visit: Payer: Medicare PPO | Admitting: Gastroenterology

## 2020-02-13 VITALS — BP 116/64 | HR 60 | Temp 98.5°F | Ht 65.0 in | Wt 111.0 lb

## 2020-02-13 DIAGNOSIS — K8501 Idiopathic acute pancreatitis with uninfected necrosis: Secondary | ICD-10-CM | POA: Diagnosis not present

## 2020-02-13 DIAGNOSIS — K5909 Other constipation: Secondary | ICD-10-CM | POA: Diagnosis not present

## 2020-02-13 DIAGNOSIS — R140 Abdominal distension (gaseous): Secondary | ICD-10-CM | POA: Diagnosis not present

## 2020-02-13 LAB — LIPASE: Lipase: 23 U/L (ref 11.0–59.0)

## 2020-02-13 LAB — CBC WITH DIFFERENTIAL/PLATELET
Basophils Absolute: 0 10*3/uL (ref 0.0–0.1)
Basophils Relative: 0.8 % (ref 0.0–3.0)
Eosinophils Absolute: 0.2 10*3/uL (ref 0.0–0.7)
Eosinophils Relative: 8 % — ABNORMAL HIGH (ref 0.0–5.0)
HCT: 37.8 % (ref 36.0–46.0)
Hemoglobin: 12.9 g/dL (ref 12.0–15.0)
Lymphocytes Relative: 46.9 % — ABNORMAL HIGH (ref 12.0–46.0)
Lymphs Abs: 1.4 10*3/uL (ref 0.7–4.0)
MCHC: 34.1 g/dL (ref 30.0–36.0)
MCV: 93.5 fl (ref 78.0–100.0)
Monocytes Absolute: 0.3 10*3/uL (ref 0.1–1.0)
Monocytes Relative: 11.1 % (ref 3.0–12.0)
Neutro Abs: 1 10*3/uL — ABNORMAL LOW (ref 1.4–7.7)
Neutrophils Relative %: 33.2 % — ABNORMAL LOW (ref 43.0–77.0)
Platelets: 174 10*3/uL (ref 150.0–400.0)
RBC: 4.05 Mil/uL (ref 3.87–5.11)
RDW: 13.9 % (ref 11.5–15.5)
WBC: 3 10*3/uL — ABNORMAL LOW (ref 4.0–10.5)

## 2020-02-13 LAB — COMPREHENSIVE METABOLIC PANEL
ALT: 8 U/L (ref 0–35)
AST: 20 U/L (ref 0–37)
Albumin: 4.3 g/dL (ref 3.5–5.2)
Alkaline Phosphatase: 80 U/L (ref 39–117)
BUN: 5 mg/dL — ABNORMAL LOW (ref 6–23)
CO2: 30 mEq/L (ref 19–32)
Calcium: 9.5 mg/dL (ref 8.4–10.5)
Chloride: 101 mEq/L (ref 96–112)
Creatinine, Ser: 0.95 mg/dL (ref 0.40–1.20)
GFR: 69.92 mL/min (ref >60.00)
Glucose, Bld: 83 mg/dL (ref 70–99)
Potassium: 3.9 mEq/L (ref 3.5–5.1)
Sodium: 140 mEq/L (ref 135–145)
Total Bilirubin: 0.6 mg/dL (ref 0.2–1.2)
Total Protein: 7.1 g/dL (ref 6.0–8.3)

## 2020-02-13 NOTE — Patient Instructions (Signed)
Your provider has requested that you go to the basement level for lab work before leaving today. Press "B" on the elevator. The lab is located at the first door on the left as you exit the elevator.  You have been scheduled for a CT scan of the abdomen and pelvis at [facility] are scheduled on 02-20-20 at 8:00 am. You should arrive 15 minutes prior to your appointment time for registration. Please follow the written instructions below on the day of your exam:  WARNING: IF YOU ARE ALLERGIC TO IODINE/X-RAY DYE, PLEASE NOTIFY RADIOLOGY IMMEDIATELY AT [phone]! YOU WILL BE GIVEN A 13 HOUR PREMEDICATION PREP.  1) Do not eat or drink anything after 4:00 am (4 hours prior to your test) 2) You have been given 2 bottles of oral contrast to drink. The solution may taste better if refrigerated, but do NOT add ice or any other liquid to this solution. Shake well before drinking.    Drink 1 bottle of contrast @ 6:00 am (2 hours prior to your exam)  Drink 1 bottle of contrast @ 7:00 am (1 hour prior to your exam)  You may take any medications as prescribed with a small amount of water, if necessary. If you take any of the following medications: METFORMIN, GLUCOPHAGE, GLUCOVANCE, AVANDAMET, RIOMET, FORTAMET, [place] MET, JANUMET, GLUMETZA or METAGLIP, you MAY be asked to HOLD this medication 48 hours AFTER the exam.  The purpose of you drinking the oral contrast is to aid in the visualization of your intestinal tract. The contrast solution may cause some diarrhea. Depending on your individual set of symptoms, you may also receive an intravenous injection of x-ray contrast/dye. Plan on being at [facility] for 30 minutes or longer, depending on the type of exam you are having performed.  This test typically takes 30-45 minutes to complete.  If you have any questions regarding your exam or if you need to reschedule, you may call the CT department at [phone] between the hours of  8:00 am and 5:00 pm, Monday-Friday.  ________________________________________________________________________

## 2020-02-13 NOTE — Progress Notes (Signed)
Referring Provider: Billie Ruddy, MD Primary Care Physician:  Billie Ruddy, MD  Reason for Consultation: Acute idiopathic pancreatitis   IMPRESSION:  Abdominal pain with associated bloating and early satiety Acute pancreatitis of unclear etiology requiring hospitalization 06/2019    -CT showing extensive peripancreatic stranding and fluid enhancement within the body/tail suggestive of early necrosis Chronic constipation    - Normal TSH and calcium    - MiraLAX causes bloating    - Psyllium powder causes bloating and gas    - OTC laxative every other day provides some relief.  Personal history of polyps    - adenomas on colonoscopy with Dr. Collene Mares 2014    - 2 tubular adenomas on colonoscopy Dr. Oletta Lamas 2020    - Dr. Oletta Lamas recommended surveillance colonoscopy in 2025 Family history of polyps (sister)  Acute pancreatitis of unclear etiology: Prior imaging shows no evidence for gallstones, biliary sludge, or other biliary obstruction.  No identified genetic.  No alcohol.  No hypertriglyceridemia.  No obvious medications associated with pancreatitis.  No history of pancreatic duct injury.  No hypercalcemia.  No known vascular disease.  Will consider further evaluation for anatomic pancreatic abnormalities and autoimmune pancreatitis.  She may ultimately benefit from cholecystectomy to reduce the risk of recurrent pancreatitis.  Abdominal pain with bloating and early satiety: Symptoms may be related to complications of pancreatitis including the development of a pancreatic pseudocyst cyst or a resulting splanchnic venous thrombosis given chronic pancreatitis.  We will start her evaluation with cross-sectional imaging.  Chronic constipation not appear to be associated per patient history.  Low threshold to pursue upper endoscopy to evaluate symptoms more consistent with dyspepsia evaluation of her pancreas is normal..  Chronic constipation: Recent TSH and calcium normal.  Continue  current management.  PLAN: CMP, CBC, lipase, IgG subtype 4 Pancreatic protocol CT including abdomen and pelvis Low threshold to consider MRI/MRCP +/- endoscopic ultrasound if appropriate after review of CT scan Follow-up in clinic 1 week after the CT scan  I spent 45 minutes, including in depth chart review, independent review of results, communicating results with the patient directly, face-to-face time with the patient, coordinating care, ordering studies and medications as appropriate, and documentation.   HPI: Teresa Mcknight is a 74 y.o. female referred by Dr. Volanda Napoleon for further evaluation of acute pancreatitis. History is obtained to the patient, review of her electronic health record, and records provided by [facility]. Had multiple gastroenterologist in the past.  Evaluated by Dr. Collene Mares in 2004.  More recently followed by Dr. Oletta Lamas at [facility].  Hospitalized with acute idiopathic pancreatitis 8/20.  Lipase was 275 on admission.  CT of the abdomen and pelvis with contrast 06/20/19 showed acute pancreatitis with extensive peripancreatic stranding and suggested early necrosis in the body and tail of the pancreas.  There is no biliary dilatation.  There is a focal fluid collection along the inferior aspect of the pancreatic body and tail.  Ultrasound showed a normal gallbladder.  There is no bile duct dilatation.  The pancreas was not well seen due to overlying bowel.  No history of diabetes. No implicated medications. No scorpion. No family history of pancreatitis, pancreatic cancer, autoimmune disease, or sarcoid.  No trauma to the pancreas.   Reports occasional pain following that diagnosis.  This pain is in the same place as her pancreatitis pain, but it is not a sharp pain. Described as an "annoying feeling" that something isn't right. Near constant bloating, early satiety. No nausea.  No identified exacerbating or relieving features.  She has lost 6 pounds unintentionally over the last couple  of months.   Long-standing history of constipation.  Using Colace and Senokot to have a bowel movement of small pellets. Occasional straining. Sense of complete evacuation. No mucous. Some blood noted on the toilet paper when she strains.  MiraLAX causes bloating.  Psyllium powder causes bloating and gas.  OTC laxative every other day provides some relief.  Drinks >60 ounces of water daily. Calcium supplements worsened the constipation.   Follows a vegan diet.   Prior endoscopic history: Polyps removed on colonoscopy with Dr. Collene Mares 07/2013 Colonoscopy performed by Dr. Oletta Lamas at [facility] 04/11/19 with removal of 2 tubular adenomas and identification of sigmoid diverticulosis.  Surveillance recommended in 5 years.  Labs 11/21/2018: TSH 2.030 Labs 06/20/2019: Triglycerides 29 Labs 08/08/19: calcium 9.9  Sister with colon polyps. No other known family history of colon cancer or polyps. No family history of uterine/endometrial cancer, pancreatic cancer or pancreatitis or gastric/stomach cancer. There is no family history sarcoid or autoimmune disease.     Past Medical History:  Diagnosis Date  . Heart murmur   . Palpitations 11/21/2018  . Pancreatitis     Past Surgical History:  Procedure Laterality Date  . COLONOSCOPY  2020   Dr Audery [facility]    Current Outpatient Medications  Medication Sig Dispense Refill  . acyclovir (ZOVIRAX) 400 MG tablet Take 1 tablet (400 mg total) by mouth 3 (three) times daily. Prn for flare ups,start < 48 hrs after sx onset and for 5 days. (Patient taking differently: Take 200 mg by mouth as needed. Prn for flare ups,start < 48 hrs after sx onset and for 5 days.) 30 tablet 1  . Cholecalciferol (VITAMIN D3) 50 MCG (2000 UT) TABS Take 2,000 Units by mouth at bedtime.    . docusate sodium (COLACE) 100 MG capsule Take 1 capsule (100 mg total) by mouth 2 (two) times daily. 60 capsule 0  . senna (SENOKOT) 8.6 MG TABS tablet Take 1 tablet (8.6 mg total) by mouth daily as  needed. 30 tablet 0  . VITAMIN B1-B12 PO Take 5,000 mg by mouth at bedtime.      No current facility-administered medications for this visit.    Allergies as of 02/13/2020 - Review Complete 02/13/2020  Allergen Reaction Noted  . Aspirin Other (See Comments) 06/20/2019  . Dairy aid [lactase]  06/20/2019  . Latex Itching 06/20/2019  . Valtrex [valacyclovir hcl] Nausea And Vomiting 06/20/2019    Family History  Problem Relation Age of Onset  . Dementia Mother   . Stroke Mother   . Stroke Father   . Multiple myeloma Sister   . Multiple myeloma Brother   . Dementia Maternal Grandmother   . Hypothyroidism Brother   . Hyperthyroidism Sister   . Cancer Neg Hx   . Diabetes Neg Hx   . Colon cancer Neg Hx     Social History   Socioeconomic History  . Marital status: Single    Spouse name: Not on file  . Number of children: 0  . Years of education: Not on file  . Highest education level: Not on file  Occupational History  . Occupation: retired  Tobacco Use  . Smoking status: Never Smoker  . Smokeless tobacco: Never Used  Substance and Sexual Activity  . Alcohol use: No  . Drug use: No  . Sexual activity: Not on file  Other Topics Concern  . Not  on file  Social History Narrative  . Not on file   Social Determinants of Health   Financial Resource Strain:   . Difficulty of Paying Living Expenses:   Food Insecurity:   . Worried About Charity fundraiser in the Last Year:   . Arboriculturist in the Last Year:   Transportation Needs:   . Film/video editor (Medical):   Marland Kitchen Lack of Transportation (Non-Medical):   Physical Activity:   . Days of Exercise per Week:   . Minutes of Exercise per Session:   Stress:   . Feeling of Stress :   Social Connections:   . Frequency of Communication with Friends and Family:   . Frequency of Social Gatherings with Friends and Family:   . Attends Religious Services:   . Active Member of Clubs or Organizations:   . Attends Theatre manager Meetings:   Marland Kitchen Marital Status:   Intimate Partner Violence:   . Fear of Current or Ex-Partner:   . Emotionally Abused:   Marland Kitchen Physically Abused:   . Sexually Abused:     Review of Systems: 12 system ROS is negative except as noted above.   Physical Exam: General:   Alert,  well-nourished, pleasant and cooperative in NAD Head:  Normocephalic and atraumatic. Eyes:  Sclera clear, no icterus.   Conjunctiva pink. Ears:  Normal auditory acuity. Nose:  No deformity, discharge,  or lesions. Mouth:  No deformity or lesions.   Neck:  Supple; no masses or thyromegaly. Lungs:  Clear throughout to auscultation.   No wheezes. Heart:  Regular rate and rhythm; no murmurs. Abdomen:  Soft, thin, nontender, nondistended, normal bowel sounds, no rebound or guarding. No hepatosplenomegaly.  No Gray-Turner or Cullen signs.  Rectal:  Deferred  Msk:  Symmetrical. No boney deformities LAD: No inguinal or umbilical LAD Extremities:  No clubbing or edema. Neurologic:  Alert and  oriented x4;  grossly nonfocal Skin:  No rash or bruise Psych:  Alert and cooperative. Normal mood and affect.     Brittney Caraway L. Tarri Glenn, MD, MPH 02/13/2020, 9:15 AM

## 2020-02-14 LAB — IGG 4: IgG, Subclass 4: 13 mg/dL (ref 2–96)

## 2020-02-18 ENCOUNTER — Telehealth: Payer: Self-pay | Admitting: Gastroenterology

## 2020-02-18 NOTE — Telephone Encounter (Signed)
See result note.  

## 2020-02-20 ENCOUNTER — Ambulatory Visit (HOSPITAL_COMMUNITY)
Admission: RE | Admit: 2020-02-20 | Discharge: 2020-02-20 | Disposition: A | Discharge status: Home / Self Care | Payer: Medicare PPO | Source: Ambulatory Visit | Attending: Gastroenterology | Admitting: Gastroenterology

## 2020-02-20 ENCOUNTER — Encounter (HOSPITAL_COMMUNITY): Payer: Self-pay

## 2020-02-20 ENCOUNTER — Other Ambulatory Visit: Payer: Self-pay

## 2020-02-20 DIAGNOSIS — K8501 Idiopathic acute pancreatitis with uninfected necrosis: Secondary | ICD-10-CM | POA: Insufficient documentation

## 2020-02-20 MED ORDER — IOHEXOL 300 MG/ML  SOLN
100.0000 mL | Freq: Once | INTRAMUSCULAR | Status: AC | PRN
  Administered 2020-02-20: 09:00:00 100 mL via INTRAVENOUS

## 2020-02-20 MED ORDER — SODIUM CHLORIDE (PF) 0.9 % IJ SOLN
INTRAMUSCULAR | Status: AC
  Filled 2020-02-20: qty 50

## 2020-02-25 ENCOUNTER — Other Ambulatory Visit: Payer: Self-pay

## 2020-02-25 DIAGNOSIS — K8689 Other specified diseases of pancreas: Secondary | ICD-10-CM

## 2020-02-25 DIAGNOSIS — K8501 Idiopathic acute pancreatitis with uninfected necrosis: Secondary | ICD-10-CM

## 2020-03-11 ENCOUNTER — Other Ambulatory Visit: Payer: Self-pay

## 2020-03-11 ENCOUNTER — Other Ambulatory Visit: Payer: Self-pay | Admitting: Gastroenterology

## 2020-03-11 ENCOUNTER — Ambulatory Visit (HOSPITAL_COMMUNITY)
Admission: RE | Admit: 2020-03-11 | Discharge: 2020-03-11 | Disposition: A | Discharge status: Home / Self Care | Payer: Medicare PPO | Source: Ambulatory Visit | Attending: Gastroenterology | Admitting: Gastroenterology

## 2020-03-11 DIAGNOSIS — K8501 Idiopathic acute pancreatitis with uninfected necrosis: Secondary | ICD-10-CM | POA: Diagnosis present

## 2020-03-11 DIAGNOSIS — K8689 Other specified diseases of pancreas: Secondary | ICD-10-CM

## 2020-03-11 MED ORDER — GADOBUTROL 1 MMOL/ML IV SOLN
10.0000 mL | Freq: Once | INTRAVENOUS | Status: AC | PRN
  Administered 2020-03-11: 5 mL via INTRAVENOUS

## 2020-03-25 ENCOUNTER — Ambulatory Visit: Payer: Medicare PPO | Admitting: Gastroenterology

## 2020-03-27 ENCOUNTER — Ambulatory Visit: Payer: Medicare PPO | Admitting: Gastroenterology

## 2020-03-27 ENCOUNTER — Encounter: Payer: Self-pay | Admitting: Gastroenterology

## 2020-03-27 VITALS — BP 126/58 | HR 78 | Temp 97.6°F | Ht 65.0 in | Wt 111.6 lb

## 2020-03-27 DIAGNOSIS — K859 Acute pancreatitis without necrosis or infection, unspecified: Secondary | ICD-10-CM | POA: Diagnosis not present

## 2020-03-27 DIAGNOSIS — K5900 Constipation, unspecified: Secondary | ICD-10-CM

## 2020-03-27 MED ORDER — LINACLOTIDE 145 MCG PO CAPS: 145.0000 ug | ORAL_CAPSULE | Freq: Every day | ORAL | 5 refills | Status: DC

## 2020-03-27 NOTE — Patient Instructions (Addendum)
Continue to follow a high fiber diet, drink at least 64 ounces of water daily, and exercise regularly. Let's try Linzess 145 mcg daily. If this causes diarrhea, you could try to reduce the dose to every other day.   I have recommended an upper endoscopy with endoscopic ultrasound to further evaluate your dilated pancreatic duct and to understand why you have had pancreatitis. We should be doing everything we can to prevent another episode of pancreatitis.  Thank you for putting your trust in me. Please call me with any questions or concerns before your upcoming ultrasound appointment.  If you are age 2 or older, your body mass index should be between 23-30. Your Body mass index is 18.57 kg/m. If this is out of the aforementioned range listed, please consider follow up with your Primary Care Provider.  If you are age 62 or younger, your body mass index should be between 19-25. Your Body mass index is 18.57 kg/m. If this is out of the aformentioned range listed, please consider follow up with your Primary Care Provider.   Due to recent COVID-19 restrictions implemented by our local and state authorities and in an effort to keep both patients and staff as safe as possible, our hospital system now requires COVID-19 testing prior to any scheduled hospital procedure. Please go to our [facility] location drive thru testing site ([address], [place], [place] [address]) on 04-27-2020 at  830am. There will be multiple testing areas, the first checkpoint being for pre-procedure/surgery testing. Get into the right (yellow) lane that leads to the PAT testing team. You will not be billed at the time of testing but may receive a bill later depending on your insurance. The approximate cost of the test is $100. You must agree to quarantine from the time of your testing until the procedure date on 04-30-2020 . This should include staying at home with ONLY the people you live with. Avoid take-out, grocery store  shopping or leaving the house for any non-emergent reason. Failure to have your COVID-19 test done on the date and time you have been scheduled will result in cancellation of procedure. Please call our office at [phone] if you have any questions.

## 2020-03-27 NOTE — Progress Notes (Signed)
Referring Provider: Billie Ruddy, MD Primary Care Physician:  Billie Ruddy, MD  Chief complaint: Acute idiopathic pancreatitis   IMPRESSION:  Abdominal pain with associated bloating and early satiety Acute pancreatitis of unclear etiology requiring hospitalization 06/2019    -CT 06/20/19: extensive peripancreatic stranding, fluid enhancement within the body/tail suggestive of early necrosis    - CT 02/20/20: mild ductal dilation in the pancreatic body, edema improved, fluid tracking to the antrum    - normal triglycerides, normal IgG4 Chronic constipation    - MRI/MRCP 03/11/20: minimal diffuse PD dilatation, no mass, no divisum, no stones    - Normal TSH and calcium    - MiraLAX causes bloating    - Psyllium powder causes bloating and gas    - OTC laxative every other day provides some relief.  Personal history of polyps    - adenomas on colonoscopy with Dr. Collene Mares 2014    - 2 tubular adenomas on colonoscopy Dr. Oletta Lamas 2020    - Dr. Oletta Lamas recommended surveillance colonoscopy in 2025 Large leiomyomatous uterus on CT Family history of polyps (sister)  Ductal dilation in the setting of idiopathic acute pancreatitis of unclear etiology with negative IgG4 levels: EGD/EUS recommended to evaluate idiopathic pancreatitis of unclear etiology and to evaluate for microlithiasis and potential chronic pancreatitis changes.   Abdominal pain with bloating and early satiety: Symptoms may be related to chronic pancreatitis although today her symptoms sound more closely associated with constipation. She also appears to have gastritis on recent CT scan.   Suspected gastritis on CT:  Will ask EUS endoscopist to evaluate her antrum at the time of EGD/EUS. Avoid NSAIDs. Start pantoprazole 40 mg QAM.   Chronic constipation: Trial of Linzess. She will use it daily to start and was instructed to adjust the dose based on her response to treatment.   Large leiomyomatous uterus: Asked to review with Dr.  Volanda Napoleon to determine if GYN consultation is appropriate.   PLAN: Trial of Linzess 145 mcg daily Avoid NSAIDs Start pantoprazole 40 mg QAM EGD with EUS Surveillance colonoscopy due 2025 Follow-up office visit after the EUS  I spent 43 minutes, including in depth chart review, independent review of results, communicating results with the patient directly, face-to-face time with the patient, coordinating care, ordering studies and medications as appropriate, and documentation.    HPI: Teresa Mcknight is a 74 y.o. female referred by Dr. Volanda Napoleon for further evaluation of acute pancreatitis. History is obtained to the patient, review of her electronic health record, and records provided by [facility]. Had multiple gastroenterologist in the past.  Evaluated by Dr. Collene Mares in 2004.  More recently followed by Dr. Oletta Lamas at [facility].    Hospitalized with acute idiopathic pancreatitis 8/20.  Lipase was 275 on admission.  CT of the abdomen and pelvis with contrast 06/20/19 showed acute pancreatitis with extensive peripancreatic stranding and suggested early necrosis in the body and tail of the pancreas.  There was no biliary dilatation.  There was a focal fluid collection along the inferior aspect of the pancreatic body and tail.  Ultrasound showed a normal gallbladder.  There was no bile duct dilatation.  The pancreas was not well seen due to overlying bowel. Triglyceride levels were normal. IgG4 normal.   No history of diabetes. No implicated medications. No scorpion. No family history of pancreatitis, pancreatic cancer, autoimmune disease, or sarcoid.  No trauma to the pancreas.   Ongoing abdominal pain with associated bloating and early satiety after a hospitalization  for acute pancreatitis of unclear etiology in August 2020. No nausea. 6 pound weight loss.  Pain is less severe, but still present most of the time as an "annoying feeling."   CT of the abdomen and pelvis with contrast 02/20/2020 showed no pancreatic  mass or pseudocyst.  There was mild pancreatic duct dilatation in the body without obstructing focus.  Normal enhancement of the pancreas.  Mild fluid which tracks from the head and body of the pancreas to the gastric antrum with probable gastritis secondary to residual pancreatic inflammation.  The stomach otherwise appears normal.  There is an enlarged leiomyomatous uterus.  MRI/MRCP 03/11/2020 showed minimal diffuse pancreatic ductal dilatation.  No pancreatic mass, pancreatic diabetes, acute pancreatitis, or peripancreatic fluid.  No evidence of biliary ductal dilatation or choledocholithiasis.  She also has a longstanding history of chronic constipation using Colace and Senokot to have small pellet bowel movements.  She did not tolerate MiraLAX. She is currently using an Equate laxative.      Prior endoscopic history: Polyps removed on colonoscopy with Dr. Collene Mares 07/2013 Colonoscopy performed by Dr. Oletta Lamas at [facility] 04/11/19 with removal of 2 tubular adenomas and identification of sigmoid diverticulosis.  Surveillance recommended in 5 years.  Labs 11/21/2018: TSH 2.030 Labs 06/20/2019: lipase 275, Triglycerides 29 Labs 06/24/2019: lipase 17 Labs 08/08/19: calcium 9.9, lipase 50 Labs 02/14/2020: Normal CMP, lipase 23, normal CBC, IgG4 13  Sister with colon polyps. No other known family history of colon cancer or polyps. No family history of uterine/endometrial cancer, pancreatic cancer or pancreatitis or gastric/stomach cancer. There is no family history sarcoid or autoimmune disease.     Past Medical History:  Diagnosis Date  . Heart murmur   . Palpitations 11/21/2018  . Pancreatitis     Past Surgical History:  Procedure Laterality Date  . COLONOSCOPY  2020   Dr Audery [facility]    Current Outpatient Medications  Medication Sig Dispense Refill  . acyclovir (ZOVIRAX) 400 MG tablet Take 1 tablet (400 mg total) by mouth 3 (three) times daily. Prn for flare ups,start < 48 hrs after sx onset  and for 5 days. (Patient taking differently: Take 200 mg by mouth as needed. Prn for flare ups,start < 48 hrs after sx onset and for 5 days.) 30 tablet 1  . Cholecalciferol (VITAMIN D3) 50 MCG (2000 UT) TABS Take 2,000 Units by mouth at bedtime.    . docusate sodium (COLACE) 100 MG capsule Take 1 capsule (100 mg total) by mouth 2 (two) times daily. 60 capsule 0  . senna (SENOKOT) 8.6 MG TABS tablet Take 1 tablet (8.6 mg total) by mouth daily as needed. 30 tablet 0  . VITAMIN B1-B12 PO Take 5,000 mg by mouth at bedtime.      No current facility-administered medications for this visit.    Allergies as of 03/27/2020 - Review Complete 03/27/2020  Allergen Reaction Noted  . Aspirin Other (See Comments) 06/20/2019  . Dairy aid [lactase]  06/20/2019  . Latex Itching 06/20/2019  . Valtrex [valacyclovir hcl] Nausea And Vomiting 06/20/2019    Family History  Problem Relation Age of Onset  . Dementia Mother   . Stroke Mother   . Stroke Father   . Multiple myeloma Sister   . Multiple myeloma Brother   . Dementia Maternal Grandmother   . Hypothyroidism Brother   . Hyperthyroidism Sister   . Cancer Neg Hx   . Diabetes Neg Hx   . Colon cancer Neg Hx  Social History   Socioeconomic History  . Marital status: Single    Spouse name: Not on file  . Number of children: 0  . Years of education: Not on file  . Highest education level: Not on file  Occupational History  . Occupation: retired  Tobacco Use  . Smoking status: Never Smoker  . Smokeless tobacco: Never Used  Substance and Sexual Activity  . Alcohol use: No  . Drug use: No  . Sexual activity: Not on file  Other Topics Concern  . Not on file  Social History Narrative  . Not on file   Social Determinants of Health   Financial Resource Strain:   . Difficulty of Paying Living Expenses:   Food Insecurity:   . Worried About Charity fundraiser in the Last Year:   . Arboriculturist in the Last Year:   Transportation Needs:    . Film/video editor (Medical):   Marland Kitchen Lack of Transportation (Non-Medical):   Physical Activity:   . Days of Exercise per Week:   . Minutes of Exercise per Session:   Stress:   . Feeling of Stress :   Social Connections:   . Frequency of Communication with Friends and Family:   . Frequency of Social Gatherings with Friends and Family:   . Attends Religious Services:   . Active Member of Clubs or Organizations:   . Attends Archivist Meetings:   Marland Kitchen Marital Status:   Intimate Partner Violence:   . Fear of Current or Ex-Partner:   . Emotionally Abused:   Marland Kitchen Physically Abused:   . Sexually Abused:     Physical Exam: Gen: Awake, alert, and oriented, and well communicative. HEENT: EOMI, non-icteric sclera, NCAT, MMM  Neck: Normal movement of head and neck  Pulm: No labored breathing, speaking in full sentences without conversational dyspnea  Abd: Mild tenderness in the midabdomen without rebound or guarding; No Murphy's signs; normal bowel sounds; no hepatosplenomegaly Derm: No apparent lesions or bruising in visible field  MS: Moves all visible extremities without noticeable abnormality  Psych: Pleasant, cooperative, normal speech, thought processing seemingly intact       Matej Sappenfield L. Tarri Glenn, MD, MPH 03/27/2020, 8:36 AM

## 2020-03-29 ENCOUNTER — Encounter: Payer: Self-pay | Admitting: Gastroenterology

## 2020-03-30 ENCOUNTER — Telehealth: Payer: Self-pay

## 2020-03-30 NOTE — Telephone Encounter (Signed)
-----   Message from Lemar Lofty., MD sent at 03/29/2020  4:35 AM EDT ----- KLB, Thanks for the update.I am glad to see that her follow-up CT scan was much improved from her August 2020 imaging.We will move forward with scheduling her for EGD/EUS for evaluation of idiopathic pancreatitis of unclear etiology with negative IgG4 levels and to evaluate for microlithiasis and potential chronic pancreatitis changes.Sabriel Borromeo, please move forward with scheduling EUS with DJ or myself in the next 4 to 6 weeks.FYI DJThanks. GM ----- Message ----- From: Tressia Danas, MD Sent: 03/27/2020   8:58 AM EDT To: Lemar Lofty., MD  I saw this patient in clinic today. You asked me to update you if she agreed to proceed and she would like to schedule her EGD/EUS. Thanks, again, for your help.   KLB

## 2020-03-30 NOTE — Telephone Encounter (Signed)
The pt has been scheduled and instructed for procedure with Dr Christella Hartigan on 6/17

## 2020-04-03 ENCOUNTER — Telehealth: Payer: Self-pay | Admitting: Gastroenterology

## 2020-04-03 NOTE — Telephone Encounter (Signed)
Please schedule office followup with me. Thank you.

## 2020-04-03 NOTE — Telephone Encounter (Signed)
Pt wants to cancel procedure, EGD/EUS, scheduled on 6/17, states not comfortable having it done.

## 2020-04-03 NOTE — Telephone Encounter (Signed)
OK, please cancel it.    It can always be resecheduled at a different time pending OV with Dr. Orvan Falconer.   Thanks

## 2020-04-06 NOTE — Telephone Encounter (Signed)
Procedure cancelled. Follow up scheduled with Dr. Orvan Falconer for 05/21/20 at 2:45 pm. Spoke w/ patient regarding follow up visit, pt states that she does not think she will be in town but she will call back to reschedule if necessary. Patient aware that procedure has been cancelled.

## 2020-04-27 ENCOUNTER — Inpatient Hospital Stay (HOSPITAL_COMMUNITY): Admission: RE | Admit: 2020-04-27 | Payer: Medicare PPO | Source: Ambulatory Visit

## 2020-04-30 ENCOUNTER — Ambulatory Visit (HOSPITAL_COMMUNITY): Admit: 2020-04-30 | Payer: Medicare PPO | Admitting: Gastroenterology

## 2020-04-30 ENCOUNTER — Encounter (HOSPITAL_COMMUNITY): Payer: Self-pay

## 2020-04-30 SURGERY — UPPER ESOPHAGEAL ENDOSCOPIC ULTRASOUND (EUS)
Anesthesia: Monitor Anesthesia Care

## 2020-05-21 ENCOUNTER — Ambulatory Visit: Payer: Medicare PPO | Admitting: Gastroenterology

## 2020-09-01 ENCOUNTER — Telehealth: Payer: Self-pay | Admitting: Family Medicine

## 2020-09-01 NOTE — Telephone Encounter (Signed)
Left voicemail for patient to get patient to schedule a medicare wellness with me.

## 2020-09-12 ENCOUNTER — Ambulatory Visit: Payer: Medicare PPO | Attending: Internal Medicine

## 2020-09-12 DIAGNOSIS — Z23 Encounter for immunization: Secondary | ICD-10-CM

## 2020-09-12 NOTE — Progress Notes (Signed)
   Covid-19 Vaccination Clinic  Name:  Teresa Mcknight    MRN: [id number] DOB: 07/17/1946  09/12/2020  [name] was observed post Covid-19 immunization for 15 minutes without incident. She was provided with Vaccine Information Sheet and instruction to access the V-Safe system.   [name] was instructed to call 911 with any severe reactions post vaccine: Marland Kitchen Difficulty breathing  . Swelling of face and throat  . A fast heartbeat  . A bad rash all over body  . Dizziness and weakness

## 2020-09-17 ENCOUNTER — Telehealth: Payer: Self-pay | Admitting: Family Medicine

## 2020-09-17 NOTE — Telephone Encounter (Signed)
Pt is calling wanting to know if Dr. Salomon Fick will do a cervical exam not a PAP so will know if she needs to find a OB-GYN for that kind of visit.  Pt state that she would like to get everything done with Dr. Salomon Fick if possible.  Pt would like to have a call back.

## 2020-09-17 NOTE — Telephone Encounter (Signed)
Pt call and stated she want a call back about a bone density that she didn't get because of covid.

## 2020-09-21 ENCOUNTER — Encounter: Payer: Medicare PPO | Admitting: Family Medicine

## 2020-09-21 NOTE — Telephone Encounter (Signed)
Left a detailed  message for pt regarding her concerns, advised pt that Dr Salomon Fick was going to discuss concerns at her appointment today but pt cancelled appointment

## 2020-09-28 NOTE — Telephone Encounter (Signed)
Left a detailed  message for pt regarding her concerns, advised pt that Dr Banks was going to discuss concerns at her appointment today but pt cancelled appointment 

## 2020-09-29 NOTE — Telephone Encounter (Signed)
Pt cancelled appointment.

## 2020-10-05 ENCOUNTER — Ambulatory Visit: Payer: Medicare PPO

## 2020-11-03 ENCOUNTER — Other Ambulatory Visit: Payer: Self-pay

## 2020-11-03 ENCOUNTER — Ambulatory Visit (INDEPENDENT_AMBULATORY_CARE_PROVIDER_SITE_OTHER): Payer: Medicare PPO

## 2020-11-03 DIAGNOSIS — Z0000 Encounter for general adult medical examination without abnormal findings: Secondary | ICD-10-CM | POA: Diagnosis not present

## 2020-11-03 NOTE — Patient Instructions (Addendum)
Teresa Mcknight , Thank you for taking time to come for your Medicare Wellness Visit. I appreciate your ongoing commitment to your health goals. Please review the following plan we discussed and let me know if I can assist you in the future.   Screening recommendations/referrals: Colonoscopy: Done 04/09/19 Mammogram: Done 11/25/17 Bone Density: Will be rescheduled  Recommended yearly ophthalmology/optometry visit for glaucoma screening and checkup Recommended yearly dental visit for hygiene and checkup  Vaccinations: Influenza vaccine: Discontinue Pneumococcal vaccine: Discontinue  Tdap vaccine: Due and discussed Shingles vaccine: Shingrix discussed. Please contact your pharmacy for coverage information.    Covid-19:Completed 2/21, 3/16, & 103/21  Advanced directives: Advance directive discussed with you today. I have provided a copy for you to complete at home and have notarized. Once this is complete please bring a copy in to our office so we can scan it into your chart.  Conditions/risks identified: Working toward getting a natural laxative  Next appointment: Follow up in one year for your annual wellness visit    Preventive Care 72 Years and Older, Female Preventive care refers to lifestyle choices and visits with your health care provider that can promote health and wellness. What does preventive care include?  A yearly physical exam. This is also called an annual well check.  Dental exams once or twice a year.  Routine eye exams. Ask your health care provider how often you should have your eyes checked.  Personal lifestyle choices, including:  Daily care of your teeth and gums.  Regular physical activity.  Eating a healthy diet.  Avoiding tobacco and drug use.  Limiting alcohol use.  Practicing safe sex.  Taking low-dose aspirin every day.  Taking vitamin and mineral supplements as recommended by your health care provider. What happens during an annual well  check? The services and screenings done by your health care provider during your annual well check will depend on your age, overall health, lifestyle risk factors, and family history of disease. Counseling  Your health care provider may ask you questions about your:  Alcohol use.  Tobacco use.  Drug use.  Emotional well-being.  Home and relationship well-being.  Sexual activity.  Eating habits.  History of falls.  Memory and ability to understand (cognition).  Work and work Astronomer.  Reproductive health. Screening  You may have the following tests or measurements:  Height, weight, and BMI.  Blood pressure.  Lipid and cholesterol levels. These may be checked every 5 years, or more frequently if you are over 41 years old.  Skin check.  Lung cancer screening. You may have this screening every year starting at age 72 if you have a 30-pack-year history of smoking and currently smoke or have quit within the past 72 years.  Fecal occult blood test (FOBT) of the stool. You may have this test every year starting at age 72.  Flexible sigmoidoscopy or colonoscopy. You may have a sigmoidoscopy every 5 years or a colonoscopy every 10 years starting at age 72.  Hepatitis C blood test.  Hepatitis B blood test.  Sexually transmitted disease (STD) testing.  Diabetes screening. This is done by checking your blood sugar (glucose) after you have not eaten for a while (fasting). You may have this done every 1-3 years.  Bone density scan. This is done to screen for osteoporosis. You may have this done starting at age 72.  Mammogram. This may be done every 1-2 years. Talk to your health care provider about how often you should have regular  mammograms. Talk with your health care provider about your test results, treatment options, and if necessary, the need for more tests. Vaccines  Your health care provider may recommend certain vaccines, such as:  Influenza vaccine. This is  recommended every year.  Tetanus, diphtheria, and acellular pertussis (Tdap, Td) vaccine. You may need a Td booster every 10 years.  Zoster vaccine. You may need this after age 62.  Pneumococcal 13-valent conjugate (PCV13) vaccine. One dose is recommended after age 65.  Pneumococcal polysaccharide (PPSV23) vaccine. One dose is recommended after age 72. Talk to your health care provider about which screenings and vaccines you need and how often you need them. This information is not intended to replace advice given to you by your health care provider. Make sure you discuss any questions you have with your health care provider. Document Released: 11/27/2015 Document Revised: 07/20/2016 Document Reviewed: 09/01/2015 Elsevier Interactive Patient Education  2017 [employer] Prevention in the Home Falls can cause injuries. They can happen to people of all ages. There are many things you can do to make your home safe and to help prevent falls. What can I do on the outside of my home?  Regularly fix the edges of walkways and driveways and fix any cracks.  Remove anything that might make you trip as you walk through a door, such as a raised step or threshold.  Trim any bushes or trees on the path to your home.  Use bright outdoor lighting.  Clear any walking paths of anything that might make someone trip, such as rocks or tools.  Regularly check to see if handrails are loose or broken. Make sure that both sides of any steps have handrails.  Any raised decks and porches should have guardrails on the edges.  Have any leaves, snow, or ice cleared regularly.  Use sand or salt on walking paths during winter.  Clean up any spills in your garage right away. This includes oil or grease spills. What can I do in the bathroom?  Use night lights.  Install grab bars by the toilet and in the tub and shower. Do not use towel bars as grab bars.  Use non-skid mats or decals in the tub or  shower.  If you need to sit down in the shower, use a plastic, non-slip stool.  Keep the floor dry. Clean up any water that spills on the floor as soon as it happens.  Remove soap buildup in the tub or shower regularly.  Attach bath mats securely with double-sided non-slip rug tape.  Do not have throw rugs and other things on the floor that can make you trip. What can I do in the bedroom?  Use night lights.  Make sure that you have a light by your bed that is easy to reach.  Do not use any sheets or blankets that are too big for your bed. They should not hang down onto the floor.  Have a firm chair that has side arms. You can use this for support while you get dressed.  Do not have throw rugs and other things on the floor that can make you trip. What can I do in the kitchen?  Clean up any spills right away.  Avoid walking on wet floors.  Keep items that you use a lot in easy-to-reach places.  If you need to reach something above you, use a strong step stool that has a grab bar.  Keep electrical cords out of the way.  Do not use floor polish or wax that makes floors slippery. If you must use wax, use non-skid floor wax.  Do not have throw rugs and other things on the floor that can make you trip. What can I do with my stairs?  Do not leave any items on the stairs.  Make sure that there are handrails on both sides of the stairs and use them. Fix handrails that are broken or loose. Make sure that handrails are as long as the stairways.  Check any carpeting to make sure that it is firmly attached to the stairs. Fix any carpet that is loose or worn.  Avoid having throw rugs at the top or bottom of the stairs. If you do have throw rugs, attach them to the floor with carpet tape.  Make sure that you have a light switch at the top of the stairs and the bottom of the stairs. If you do not have them, ask someone to add them for you. What else can I do to help prevent  falls?  Wear shoes that:  Do not have high heels.  Have rubber bottoms.  Are comfortable and fit you well.  Are closed at the toe. Do not wear sandals.  If you use a stepladder:  Make sure that it is fully opened. Do not climb a closed stepladder.  Make sure that both sides of the stepladder are locked into place.  Ask someone to hold it for you, if possible.  Clearly mark and make sure that you can see:  Any grab bars or handrails.  First and last steps.  Where the edge of each step is.  Use tools that help you move around (mobility aids) if they are needed. These include:  Canes.  Walkers.  Scooters.  Crutches.  Turn on the lights when you go into a dark area. Replace any light bulbs as soon as they burn out.  Set up your furniture so you have a clear path. Avoid moving your furniture around.  If any of your floors are uneven, fix them.  If there are any pets around you, be aware of where they are.  Review your medicines with your doctor. Some medicines can make you feel dizzy. This can increase your chance of falling. Ask your doctor what other things that you can do to help prevent falls. This information is not intended to replace advice given to you by your health care provider. Make sure you discuss any questions you have with your health care provider. Document Released: 08/27/2009 Document Revised: 04/07/2016 Document Reviewed: 12/05/2014 Elsevier Interactive Patient Education  2017 [employer].

## 2020-11-03 NOTE — Progress Notes (Signed)
Virtual Visit via Telephone Note  I connected with  Teresa Mcknight on 11/03/20 at  1:00 PM EST by telephone and verified that I am speaking with the correct person using two identifiers.  Medicare Annual Wellness visit completed telephonically due to Covid-19 pandemic.   Persons participating in this call: This Health Coach and this patient.   Location: Patient: Home Provider: Office   I discussed the limitations, risks, security and privacy concerns of performing an evaluation and management service by telephone and the availability of in person appointments. The patient expressed understanding and agreed to proceed.  Unable to perform video visit due to video visit attempted and failed and/or patient does not have video capability.   Some vital signs may be absent or patient reported.   Willette Brace, LPN    Subjective:   Teresa Mcknight is a 74 y.o. female who presents for an Initial Medicare Annual Wellness Visit.  Review of Systems     Cardiac Risk Factors include: advanced age (>71mn, >>20women)     Objective:    There were no vitals filed for this visit. There is no height or weight on file to calculate BMI.  Advanced Directives 11/03/2020 06/21/2019  Does Patient Have a Medical Advance Directive? No No  Does patient want to make changes to medical advance directive? Yes (MAU/Ambulatory/Procedural Areas - Information given) -  Would patient like information on creating a medical advance directive? - No - Patient declined    Current Medications (verified) Outpatient Encounter Medications as of 11/03/2020  Medication Sig  . Cholecalciferol (VITAMIN D3) 50 MCG (2000 UT) TABS Take 2,000 Units by mouth at bedtime.  .Marland KitchenVITAMIN B1-B12 PO Take 5,000 mg by mouth at bedtime.   .Marland Kitchenacyclovir (ZOVIRAX) 400 MG tablet Take 1 tablet (400 mg total) by mouth 3 (three) times daily. Prn for flare ups,start < 48 hrs after sx onset and for 5 days. (Patient not taking: Reported on  11/03/2020)  . [DISCONTINUED] linaclotide (LINZESS) 145 MCG CAPS capsule Take 1 capsule (145 mcg total) by mouth daily before breakfast. (Patient not taking: Reported on 11/03/2020)   No facility-administered encounter medications on file as of 11/03/2020.    Allergies (verified) Aspirin, Dairy aid [lactase], Latex, and Valtrex [valacyclovir hcl]   History: Past Medical History:  Diagnosis Date  . Heart murmur   . Palpitations 11/21/2018  . Pancreatitis    Past Surgical History:  Procedure Laterality Date  . COLONOSCOPY  2020   [facility]   Family History  Problem Relation Age of Onset  . Dementia Mother   . Stroke Mother   . Stroke Father   . Multiple myeloma Sister   . Multiple myeloma Brother   . Dementia Maternal Grandmother   . Hypothyroidism Brother   . Hyperthyroidism Sister   . Cancer Neg Hx   . Diabetes Neg Hx   . Colon cancer Neg Hx    Social History   Socioeconomic History  . Marital status: Single    Spouse name: Not on file  . Number of children: 0  . Years of education: Not on file  . Highest education level: Not on file  Occupational History  . Occupation: retired  Tobacco Use  . Smoking status: Never Smoker  . Smokeless tobacco: Never Used  Substance and Sexual Activity  . Alcohol use: No  . Drug use: No  . Sexual activity: Not on file  Other Topics Concern  . Not on file  Social History Narrative  . Not on file   Social Determinants of Health   Financial Resource Strain: Low Risk   . Difficulty of Paying Living Expenses: Not hard at all  Food Insecurity: No Food Insecurity  . Worried About Charity fundraiser in the Last Year: Never true  . Ran Out of Food in the Last Year: Never true  Transportation Needs: No Transportation Needs  . Lack of Transportation (Medical): No  . Lack of Transportation (Non-Medical): No  Physical Activity: Sufficiently Active  . Days of Exercise per Week: 5 days  . Minutes of Exercise per Session:  60 min  Stress: No Stress Concern Present  . Feeling of Stress : Not at all  Social Connections: Moderately Isolated  . Frequency of Communication with Friends and Family: More than three times a week  . Frequency of Social Gatherings with Friends and Family: More than three times a week  . Attends Religious Services: 1 to 4 times per year  . Active Member of Clubs or Organizations: No  . Attends Archivist Meetings: Never  . Marital Status: Never married    Tobacco Counseling Counseling given: Not Answered   Clinical Intake:  Pre-visit preparation completed: Yes  Pain : No/denies pain     BMI - recorded: 18.57 Nutritional Status: BMI <19  Underweight Nutritional Risks: None Diabetes: No  How often do you need to have someone help you when you read instructions, pamphlets, or other written materials from your doctor or pharmacy?: 1 - Never  Diabetic?No  Interpreter Needed?: No  Information entered by :: Charlott Rakes, LPN   Activities of Daily Living In your present state of health, do you have any difficulty performing the following activities: 11/03/2020  Hearing? N  Vision? N  Difficulty concentrating or making decisions? N  Walking or climbing stairs? N  Dressing or bathing? N  Doing errands, shopping? N  Preparing Food and eating ? N  Using the Toilet? N  In the past six months, have you accidently leaked urine? N  Do you have problems with loss of bowel control? N  Managing your Medications? N  Managing your Finances? N  Housekeeping or managing your Housekeeping? N  Some recent data might be hidden    Patient Care Team: Billie Ruddy, MD as PCP - General (Family Medicine) Skeet Latch, MD as PCP - Cardiology (Cardiology)  Indicate any recent Medical Services you may have received from other than [facility] providers in the past year (date may be approximate).     Assessment:   This is a routine wellness examination for  Teresa Mcknight.  Hearing/Vision screen  Hearing Screening   125Hz  250Hz  500Hz  1000Hz  2000Hz  3000Hz  4000Hz  6000Hz  8000Hz   Right ear:           Left ear:           Comments: Denies any hearing at this time  Vision Screening Comments: Pt follows Dr Prudencio Burly for annual eye exams   Dietary issues and exercise activities discussed: Current Exercise Habits: Home exercise routine;Structured exercise class, Type of exercise: strength training/weights;stretching;yoga;Other - see comments, Time (Minutes): 60, Frequency (Times/Week): 5, Weekly Exercise (Minutes/Week): 300  Goals    . Patient Stated     Working toward getting a natural laxative       Depression Screen PHQ 2/9 Scores 11/03/2020  PHQ - 2 Score 0    Fall Risk Fall Risk  11/03/2020 06/17/2019  Falls in the past year? 0 (No  Data)  Comment - Emmi Telephone Survey: data to providers prior to load  Number falls in past yr: 0 (No Data)  Comment - Emmi Telephone Survey Actual Response =   Injury with Fall? 0 -  Follow up Falls prevention discussed -    FALL RISK PREVENTION PERTAINING TO THE HOME:  Any stairs in or around the home? Yes  If so, are there any without handrails? No  Home free of loose throw rugs in walkways, pet beds, electrical cords, etc? Yes  Adequate lighting in your home to reduce risk of falls? Yes   ASSISTIVE DEVICES UTILIZED TO PREVENT FALLS:  Life alert? No  Use of a cane, walker or w/c? No  Grab bars in the bathroom? No  Shower chair or bench in shower? No  Elevated toilet seat or a handicapped toilet? Yes   TIMED UP AND GO:  Was the test performed? No .     Cognitive Function:     6CIT Screen 11/03/2020  What Year? 0 points  What month? 0 points  Count back from 20 0 points  Months in reverse 0 points  Repeat phrase 0 points    Immunizations Immunization History  Administered Date(s) Administered  . PFIZER SARS-COV-2 Vaccination 01/05/2020, 01/28/2020, 09/12/2020    TDAP status: Due,  Education has been provided regarding the importance of this vaccine. Advised may receive this vaccine at local pharmacy or Health Dept. Aware to provide a copy of the vaccination record if obtained from local pharmacy or Health Dept. Verbalized acceptance and understanding.  Flu Vaccine status: Declined, Education has been provided regarding the importance of this vaccine but patient still declined. Advised may receive this vaccine at local pharmacy or Health Dept. Aware to provide a copy of the vaccination record if obtained from local pharmacy or Health Dept. Verbalized acceptance and understanding.  Pneumococcal vaccine status: Declined,  Education has been provided regarding the importance of this vaccine but patient still declined. Advised may receive this vaccine at local pharmacy or Health Dept. Aware to provide a copy of the vaccination record if obtained from local pharmacy or Health Dept. Verbalized acceptance and understanding.   Covid-19 vaccine status: Completed vaccines  Qualifies for Shingles Vaccine? Yes   Zostavax completed No   Shingrix Completed?: No.    Education has been provided regarding the importance of this vaccine. Patient has been advised to call insurance company to determine out of pocket expense if they have not yet received this vaccine. Advised may also receive vaccine at local pharmacy or Health Dept. Verbalized acceptance and understanding.  Screening Tests Health Maintenance  Topic Date Due  . Hepatitis C Screening  Never done  . TETANUS/TDAP  Never done  . MAMMOGRAM  01/12/2021 (Originally 11/26/2019)  . DEXA SCAN  01/12/2021 (Originally 11/20/2012)  . COLONOSCOPY  04/08/2029  . COVID-19 Vaccine  Completed  . INFLUENZA VACCINE  Discontinued  . PNA vac Low Risk Adult  Discontinued    Health Maintenance  Health Maintenance Due  Topic Date Due  . Hepatitis C Screening  Never done  . TETANUS/TDAP  Never done    Colorectal cancer screening: Type of  screening: Colonoscopy. Completed 04/09/19. Repeat every 10 years  Mammogram status: Completed 11/25/17. Repeat every year  Bone density: will be rescheduled per equipment repair   Additional Screening:  Hepatitis C Screening: does qualify  Vision Screening: Recommended annual ophthalmology exams for early detection of glaucoma and other disorders of the eye. Is the patient up to  date with their annual eye exam?  Yes  Who is the provider or what is the name of the office in which the patient attends annual eye exams? Dr Prudencio Burly   Dental Screening: Recommended annual dental exams for proper oral hygiene  Community Resource Referral / Chronic Care Management: CRR required this visit?  No   CCM required this visit?  No      Plan:     I have personally reviewed and noted the following in the patient's chart:   . Medical and social history . Use of alcohol, tobacco or illicit drugs  . Current medications and supplements . Functional ability and status . Nutritional status . Physical activity . Advanced directives . List of other physicians . Hospitalizations, surgeries, and ER visits in previous 12 months . Vitals . Screenings to include cognitive, depression, and falls . Referrals and appointments  In addition, I have reviewed and discussed with patient certain preventive protocols, quality metrics, and best practice recommendations. A written personalized care plan for preventive services as well as general preventive health recommendations were provided to patient.     Willette Brace, LPN   20/23/3435   Nurse Notes: None

## 2021-01-12 ENCOUNTER — Telehealth: Payer: Self-pay | Admitting: Gastroenterology

## 2021-01-12 NOTE — Telephone Encounter (Signed)
Pt saw Dr. Orvan Falconer for pancreatitis. It was recommended that she have and EUS but pt did not want to have those procedures done. Pt had a F/U in July and cancelled that appt. Pt calling to see if she needs to f/u with Dr. Orvan Falconer. Pt scheduled to see Dr. Orvan Falconer 4/6@10 :30am. Pt aware of appt.

## 2021-01-12 NOTE — Telephone Encounter (Signed)
Pt is requesting a call back from a nurse to ask a question regarding her pancreas, pt did not disclose any further information

## 2021-02-04 DIAGNOSIS — M85852 Other specified disorders of bone density and structure, left thigh: Secondary | ICD-10-CM | POA: Diagnosis not present

## 2021-02-04 DIAGNOSIS — Z1231 Encounter for screening mammogram for malignant neoplasm of breast: Secondary | ICD-10-CM | POA: Diagnosis not present

## 2021-02-04 DIAGNOSIS — M85851 Other specified disorders of bone density and structure, right thigh: Secondary | ICD-10-CM | POA: Diagnosis not present

## 2021-02-04 LAB — HM MAMMOGRAPHY

## 2021-02-04 LAB — HM DEXA SCAN

## 2021-02-09 ENCOUNTER — Encounter: Payer: Self-pay | Admitting: Family Medicine

## 2021-02-13 IMAGING — MR MR 3D RECON AT SCANNER
17 of 21 series · 38 of 48 positions shown · IV contrast (gadavist)
Comparison: CT on 02/20/2020

CLINICAL DATA: Recurrent pancreatitis. Peripancreatic fluid and
pancreatic ductal dilatation.



[Series 4: T2 fat-sat · axial · 6.0mm · 1.09mm/px · 1 of 36 slices shown]
[im 1/36]
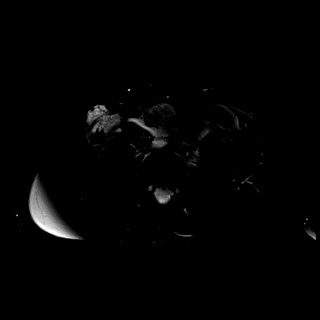

[Series 7: bSSFP · coronal · 6.0mm · 0.70mm/px · 1 of 33 slices shown]
[im 1/33]
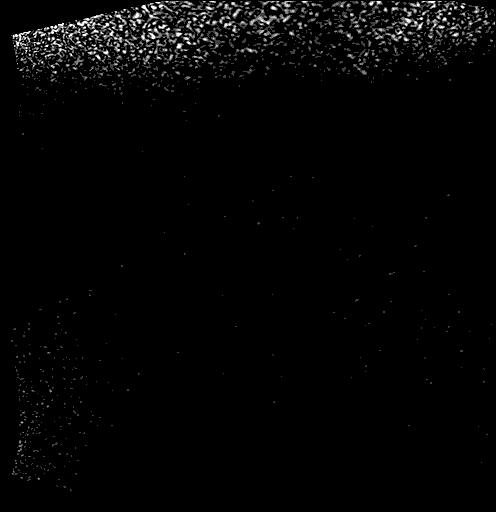

[Series 8: DWI · axial · 6.0mm · 1.49mm/px · z∈[-130,+122]mm · 2 of 72 slices shown (1 of 2)]
[im 1/72]
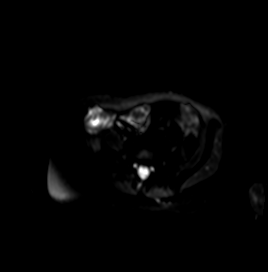
[im 72/72]
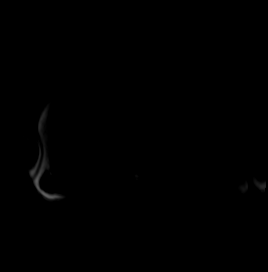

[Series 9: DWI · axial · 6.0mm · 1.49mm/px · 1 of 36 slices shown (2 of 2)]
[im 1/36]
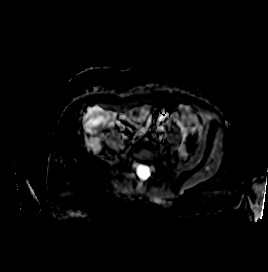

[Series 10: T1 · axial · 3.0mm · 1.06mm/px · z∈[-106,+107]mm · 3 of 72 slices shown (1 of 2)]
[im 1/72]
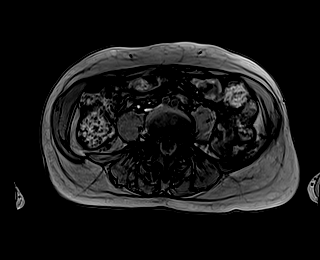
[im 36/72]
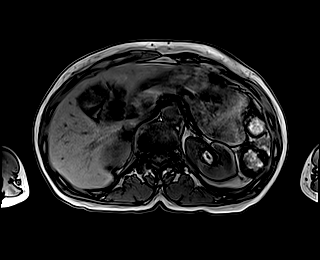
[im 72/72]
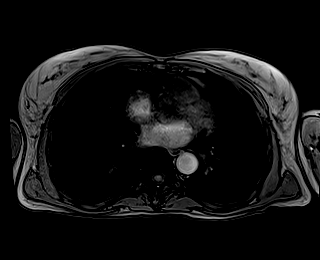

[Series 11: T1 · axial · 3.0mm · 1.06mm/px · z∈[-106,+107]mm · 3 of 72 slices shown (2 of 2)]
[im 1/72]
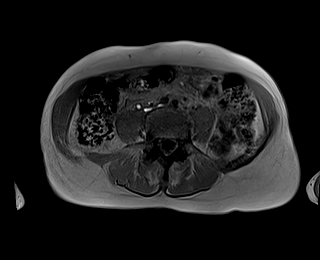
[im 36/72]
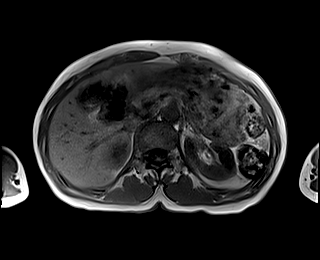
[im 72/72]
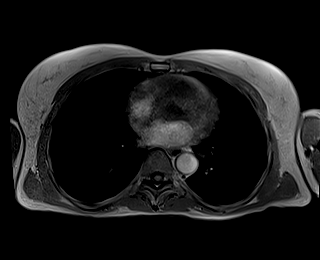

[Series 12: cor obl thk · sagittal · 50.0mm · 0.78mm/px · 1 of 9 slices shown]
[im 1/9]
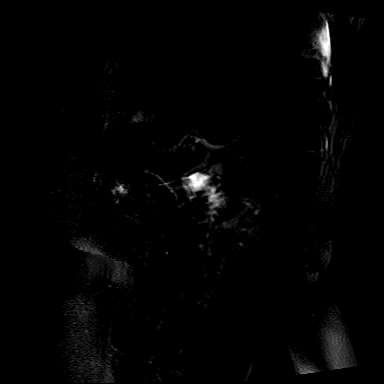

[Series 14: cor_3d_spc_trig · coronal · 1.0mm · 0.49mm/px · 3 of 80 slices shown]
[im 1/80]
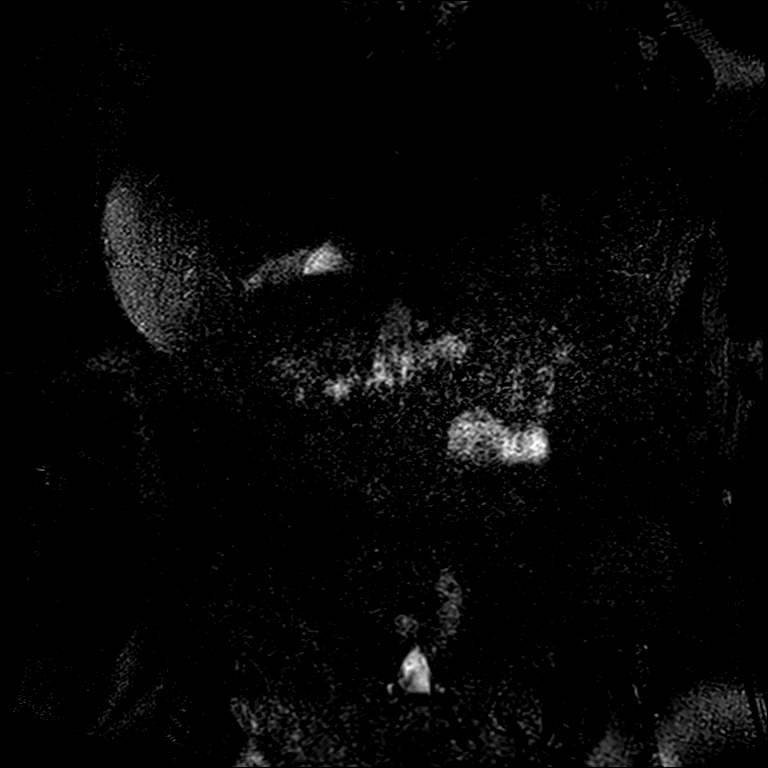
[im 40/80]
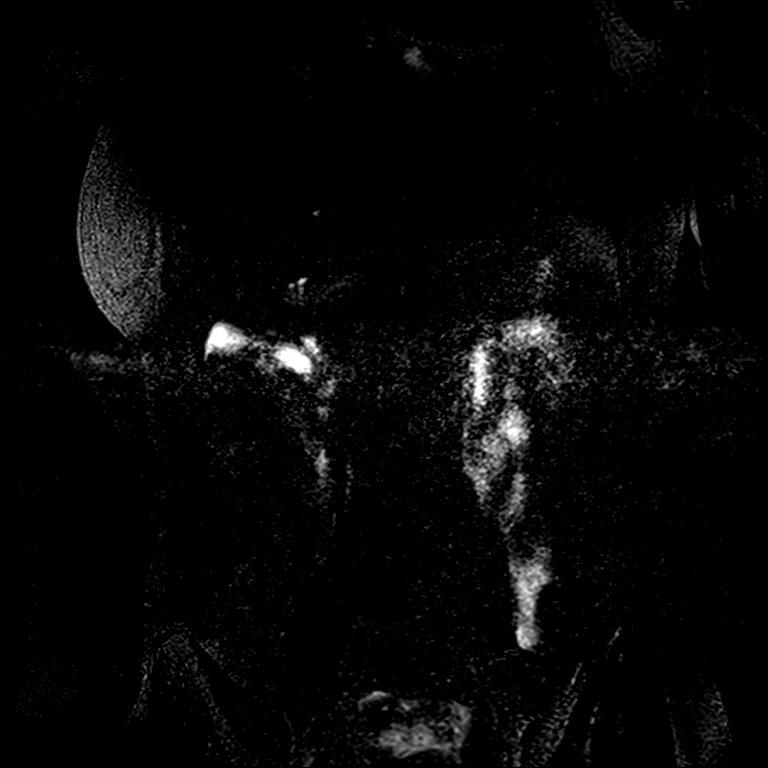
[im 80/80]
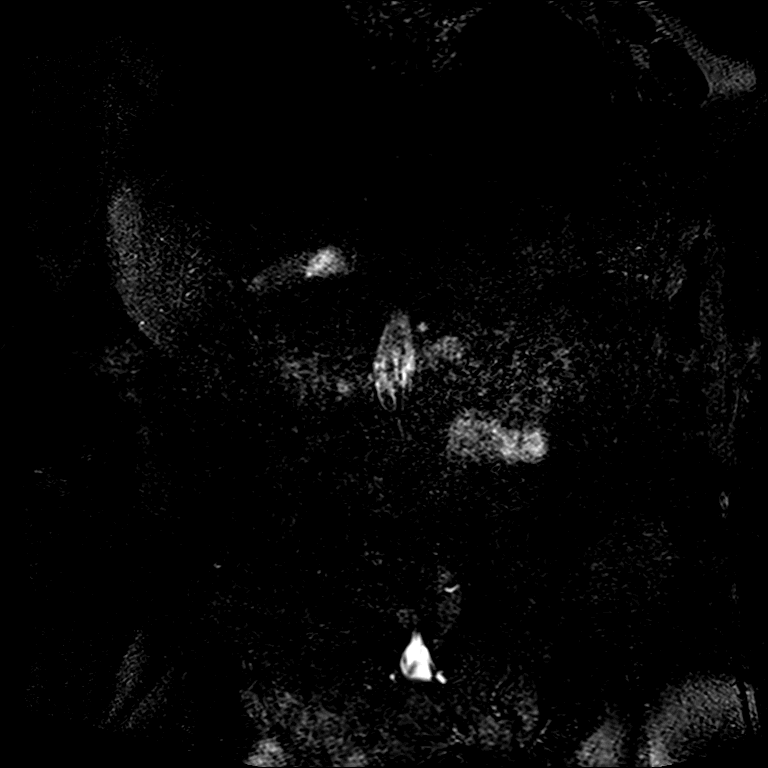

[Series 16: T2 · axial · 6.0mm · 1.37mm/px · 1 of 34 slices shown]
[im 1/34]
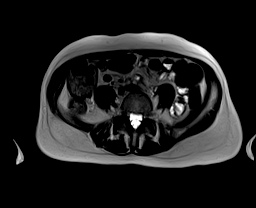

[Series 19: T1 dynamic · axial · 3.0mm · 1.06mm/px · z∈[-107,+130]mm · 3 of 80 slices shown (1 of 6)]
[im 1/80]
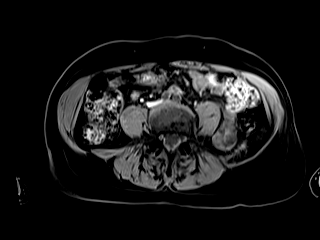
[im 40/80]
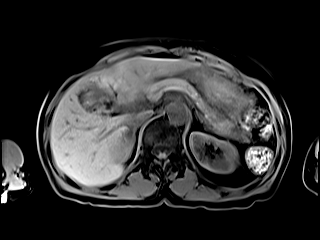
[im 80/80]
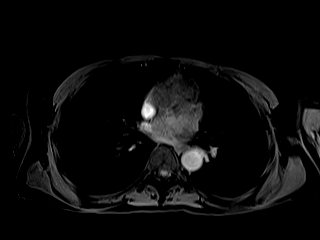

[Series 22: T1 dynamic · axial · 3.0mm · 1.06mm/px · z∈[-107,+130]mm · 3 of 80 slices shown (2 of 6)]
[im 1/80]
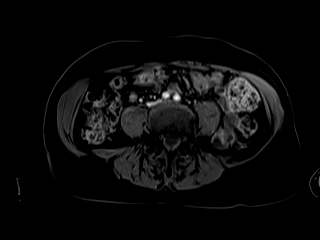
[im 40/80]
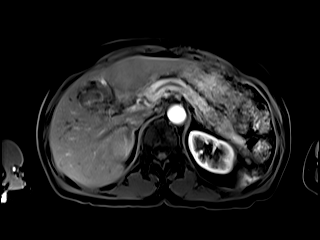
[im 80/80]
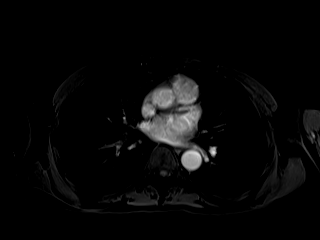

[Series 24: T1 dynamic · axial · 3.0mm · 1.06mm/px · z∈[-107,+130]mm · 3 of 80 slices shown (3 of 6)]
[im 1/80]
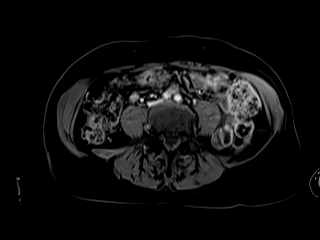
[im 40/80]
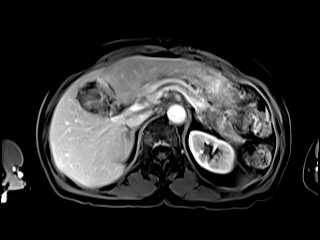
[im 80/80]
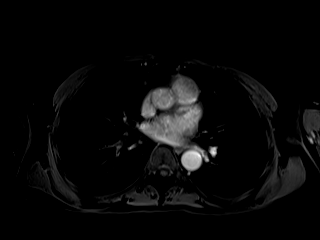

[Series 26: T1 dynamic · axial · 3.0mm · 1.06mm/px · z∈[-107,+130]mm · 3 of 80 slices shown (4 of 6)]
[im 1/80]
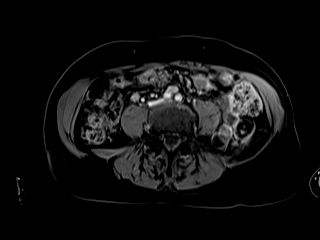
[im 40/80]
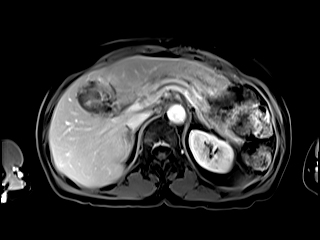
[im 80/80]
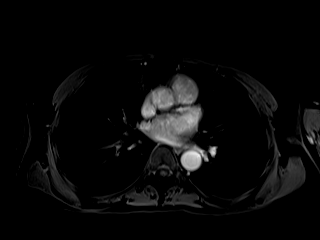

[Series 28: T1 dynamic · coronal · 3.0mm · 1.25mm/px · 3 of 72 slices shown (5 of 6)]
[im 1/72]
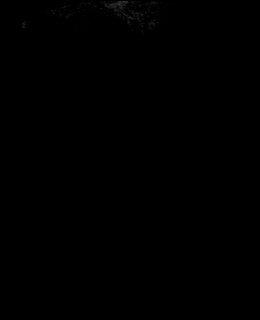
[im 36/72]
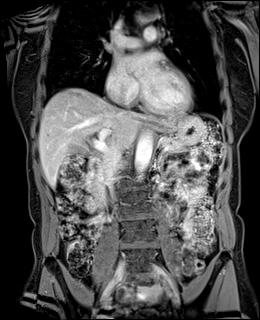
[im 72/72]
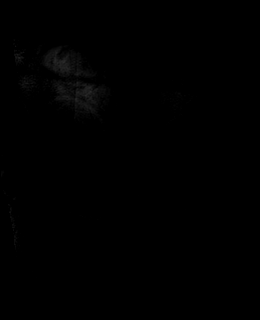

[Series 30: T1 dynamic · axial · 3.0mm · 1.06mm/px · z∈[-107,+130]mm · 3 of 80 slices shown (6 of 6)]
[im 1/80]
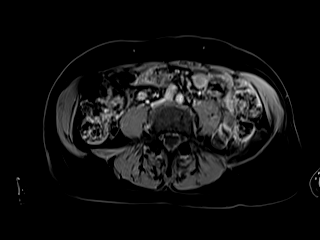
[im 40/80]
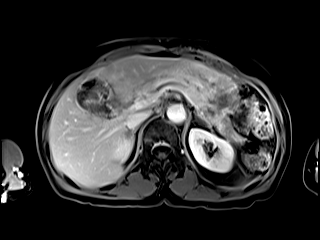
[im 80/80]
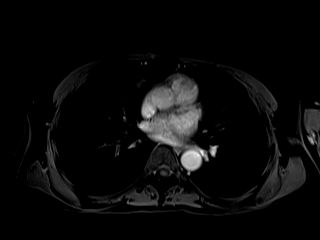

[Series 101: sub 20 sec · axial · 3.0mm · 1.06mm/px · z∈[-107,+130]mm · 3 of 80 slices shown]
[im 1/80]
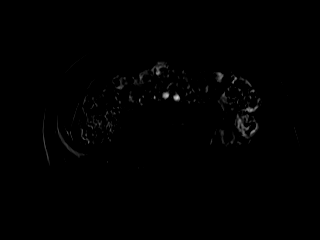
[im 40/80]
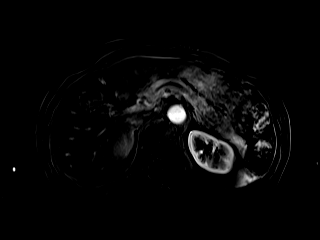
[im 80/80]
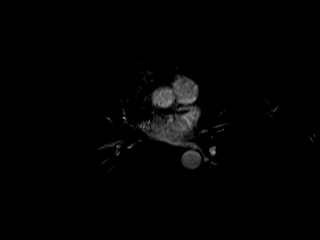

[Series 102: sub 45 sec · axial · 3.0mm · 1.06mm/px · 1 of 80 slices shown]
[im 1/80]
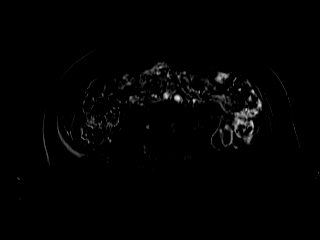

[38 of 48 positions shown; findings below may reference images not displayed]

FINDINGS: Lower chest: No acute findings.

Hepatobiliary: No hepatic masses identified. A few tiny sub-cm cysts
are seen in the anterior right lobe. Gallbladder is unremarkable. No
evidence of biliary ductal dilatation or choledocholithiasis.

Pancreas: No mass or inflammatory changes. No evidence of
peripancreatic fluid collections. Minimal diffuse pancreatic ductal
dilatation is seen, measuring 3-4 mm in maximum diameter. No
evidence of pancreatic duct stricture or obstruction, raising
suspicion for chronic pancreatitis. No evidence of pancreas divisum.

Spleen:  Within normal limits in size and appearance.

Adrenals/Urinary Tract: No masses identified. A few tiny sub-cm
renal cysts are noted bilaterally. No evidence of hydronephrosis.

Stomach/Bowel: Visualized portion unremarkable.

Vascular/Lymphatic: No pathologically enlarged lymph nodes
identified. No abdominal aortic aneurysm.

Other:  None.

Musculoskeletal:  No suspicious bone lesions identified.
IMPRESSION: 1. Minimal diffuse pancreatic ductal dilatation, which may be seen
chronic pancreatitis. No evidence of acute pancreatitis or
peripancreatic fluid.
2. No evidence of pancreatic mass or pancreas divisum.
3. No evidence of biliary ductal dilatation or choledocholithiasis.

## 2021-02-17 ENCOUNTER — Ambulatory Visit: Payer: Medicare PPO | Admitting: Gastroenterology

## 2021-02-19 ENCOUNTER — Encounter: Payer: Medicare PPO | Admitting: Family Medicine

## 2021-02-19 DIAGNOSIS — M7751 Other enthesopathy of right foot: Secondary | ICD-10-CM | POA: Diagnosis not present

## 2021-02-19 DIAGNOSIS — H2513 Age-related nuclear cataract, bilateral: Secondary | ICD-10-CM | POA: Diagnosis not present

## 2021-02-19 DIAGNOSIS — H40013 Open angle with borderline findings, low risk, bilateral: Secondary | ICD-10-CM | POA: Diagnosis not present

## 2021-02-19 DIAGNOSIS — M19071 Primary osteoarthritis, right ankle and foot: Secondary | ICD-10-CM | POA: Diagnosis not present

## 2021-02-19 DIAGNOSIS — M7752 Other enthesopathy of left foot: Secondary | ICD-10-CM | POA: Diagnosis not present

## 2021-02-19 DIAGNOSIS — H25013 Cortical age-related cataract, bilateral: Secondary | ICD-10-CM | POA: Diagnosis not present

## 2021-02-19 DIAGNOSIS — H5213 Myopia, bilateral: Secondary | ICD-10-CM | POA: Diagnosis not present

## 2021-02-19 DIAGNOSIS — M792 Neuralgia and neuritis, unspecified: Secondary | ICD-10-CM | POA: Diagnosis not present

## 2021-02-19 DIAGNOSIS — B351 Tinea unguium: Secondary | ICD-10-CM | POA: Diagnosis not present

## 2021-02-19 DIAGNOSIS — M19072 Primary osteoarthritis, left ankle and foot: Secondary | ICD-10-CM | POA: Diagnosis not present

## 2021-03-04 ENCOUNTER — Encounter: Payer: Self-pay | Admitting: Family Medicine

## 2021-03-04 ENCOUNTER — Other Ambulatory Visit: Payer: Self-pay

## 2021-03-04 ENCOUNTER — Ambulatory Visit (INDEPENDENT_AMBULATORY_CARE_PROVIDER_SITE_OTHER): Payer: Medicare PPO | Admitting: Family Medicine

## 2021-03-04 VITALS — BP 110/60 | HR 79 | Temp 98.4°F | Ht 63.5 in

## 2021-03-04 DIAGNOSIS — M8580 Other specified disorders of bone density and structure, unspecified site: Secondary | ICD-10-CM | POA: Diagnosis not present

## 2021-03-04 DIAGNOSIS — R200 Anesthesia of skin: Secondary | ICD-10-CM

## 2021-03-04 DIAGNOSIS — Z1159 Encounter for screening for other viral diseases: Secondary | ICD-10-CM | POA: Diagnosis not present

## 2021-03-04 DIAGNOSIS — Z0000 Encounter for general adult medical examination without abnormal findings: Secondary | ICD-10-CM | POA: Diagnosis not present

## 2021-03-04 LAB — CBC: RBC: 3.82 — AB (ref 3.87–5.11)

## 2021-03-04 LAB — CBC AND DIFFERENTIAL
HCT: 36 (ref 36–46)
Platelets: 174 (ref 150–399)

## 2021-03-04 NOTE — Patient Instructions (Addendum)
Osteopenia noted on bone density scan.  You should try to eat food rich in calcium.  You should start taking vitamin D 800 IU daily, Calcium 1200 mg daily, as well as doing weight bearing exercises to strengthen your bones.  30 min per day of brisk walking is a good exercise.  Repeat bone density scan in 2 yrs.  [facility] -Crawford Givens, MD  -Waymon Amato, MD - Everett Graff, MD -Kennis Carina, MD ---Kendall Flack, MD is listed on their site, but I'm pretty sure she retired.  [place], DO  [place], MD  -Drema Dallas, DO    Preventive Care 73 Years and Older, Female Preventive care refers to lifestyle choices and visits with your health care provider that can promote health and wellness. This includes:  A yearly physical exam. This is also called an annual wellness visit.  Regular dental and eye exams.  Immunizations.  Screening for certain conditions.  Healthy lifestyle choices, such as: ? Eating a healthy diet. ? Getting regular exercise. ? Not using drugs or products that contain nicotine and tobacco. ? Limiting alcohol use. What can I expect for my preventive care visit? Physical exam Your health care provider will check your:  Height and weight. These may be used to calculate your BMI (body mass index). BMI is a measurement that tells if you are at a healthy weight.  Heart rate and blood pressure.  Body temperature.  Skin for abnormal spots. Counseling Your health care provider may ask you questions about your:  Past medical problems.  Family's medical history.  Alcohol, tobacco, and drug use.  Emotional well-being.  Home life and relationship well-being.  Sexual activity.  Diet, exercise, and sleep habits.  History of falls.  Memory and ability to understand (cognition).  Work and work Statistician.  Pregnancy and menstrual history.  Access to firearms. What immunizations do I  need? Vaccines are usually given at various ages, according to a schedule. Your health care provider will recommend vaccines for you based on your age, medical history, and lifestyle or other factors, such as travel or where you work.   What tests do I need? Blood tests  Lipid and cholesterol levels. These may be checked every 5 years, or more often depending on your overall health.  Hepatitis C test.  Hepatitis B test. Screening  Lung cancer screening. You may have this screening every year starting at age 1 if you have a 30-pack-year history of smoking and currently smoke or have quit within the past 15 years.  Colorectal cancer screening. ? All adults should have this screening starting at age 21 and continuing until age 73. ? Your health care provider may recommend screening at age 73 if you are at increased risk. ? You will have tests every 1-10 years, depending on your results and the type of screening test.  Diabetes screening. ? This is done by checking your blood sugar (glucose) after you have not eaten for a while (fasting). ? You may have this done every 1-3 years.  Mammogram. ? This may be done every 1-2 years. ? Talk with your health care provider about how often you should have regular mammograms.  Abdominal aortic aneurysm (AAA) screening. You may need this if you are a current or former smoker.  BRCA-related cancer screening. This may be done if you have a family history of breast, ovarian, tubal, or peritoneal cancers. Other tests  STD (sexually transmitted disease) testing, if you  are at risk.  Bone density scan. This is done to screen for osteoporosis. You may have this done starting at age 73. Talk with your health care provider about your test results, treatment options, and if necessary, the need for more tests. Follow these instructions at home: Eating and drinking  Eat a diet that includes fresh fruits and vegetables, whole grains, lean protein, and  low-fat dairy products. Limit your intake of foods with high amounts of sugar, saturated fats, and salt.  Take vitamin and mineral supplements as recommended by your health care provider.  Do not drink alcohol if your health care provider tells you not to drink.  If you drink alcohol: ? Limit how much you have to 0-1 drink a day. ? Be aware of how much alcohol is in your drink. In the [place] one drink equals one 12 oz bottle of beer (355 mL), one 5 oz glass of wine (148 mL), or one 1 oz glass of hard liquor (44 mL).   Lifestyle  Take daily care of your teeth and gums. Brush your teeth every morning and night with fluoride toothpaste. Floss one time each day.  Stay active. Exercise for at least 30 minutes 5 or more days each week.  Do not use any products that contain nicotine or tobacco, such as cigarettes, e-cigarettes, and chewing tobacco. If you need help quitting, ask your health care provider.  Do not use drugs.  If you are sexually active, practice safe sex. Use a condom or other form of protection in order to prevent STIs (sexually transmitted infections).  Talk with your health care provider about taking a low-dose aspirin or statin.  Find healthy ways to cope with stress, such as: ? Meditation, yoga, or listening to music. ? Journaling. ? Talking to a trusted person. ? Spending time with friends and family. Safety  Always wear your seat belt while driving or riding in a vehicle.  Do not drive: ? If you have been drinking alcohol. Do not ride with someone who has been drinking. ? When you are tired or distracted. ? While texting.  Wear a helmet and other protective equipment during sports activities.  If you have firearms in your house, make sure you follow all gun safety procedures. What's next?  Visit your health care provider once a year for an annual wellness visit.  Ask your health care provider how often you should have your eyes and teeth checked.  Stay up  to date on all vaccines. This information is not intended to replace advice given to you by your health care provider. Make sure you discuss any questions you have with your health care provider. Document Revised: 10/21/2020 Document Reviewed: 10/25/2018 Elsevier Patient Education  2021 [place].  Osteopenia  Osteopenia is a loss of thickness (density) inside the bones. Another name for osteopenia is low bone mass. Mild osteopenia is a normal part of aging. It is not a disease, and it does not cause symptoms. However, if you have osteopenia and continue to lose bone mass, you could develop a condition that causes the bones to become thin and break more easily (osteoporosis). Osteoporosis can cause you to lose some height, have back pain, and have a stooped posture. Although osteopenia is not a disease, making changes to your lifestyle and diet can help to prevent osteopenia from developing into osteoporosis. What are the causes? Osteopenia is caused by loss of calcium in the bones. Bones are constantly changing. Old bone cells are continually  being replaced with new bone cells. This process builds new bone. The mineral calcium is needed to build new bone and maintain bone density. Bone density is usually highest around age 57. After that, most people's bodies cannot replace all the bone they have lost with new bone. What increases the risk? You are more likely to develop this condition if:  You are older than age 79.  You are a woman who went through menopause early.  You have a long illness that keeps you in bed.  You do not get enough exercise.  You lack certain nutrients (malnutrition).  You have an overactive thyroid gland (hyperthyroidism).  You use products that contain nicotine or tobacco, such as cigarettes, e-cigarettes and chewing tobacco, or you drink a lot of alcohol.  You are taking medicines that weaken the bones, such as steroids. What are the signs or  symptoms? This condition does not cause any symptoms. You may have a slightly higher risk for bone breaks (fractures), so getting fractures more easily than normal may be an indication of osteopenia. How is this diagnosed? This condition may be diagnosed based on an X-ray exam that measures bone density (dual-energy X-ray absorptiometry, or DEXA). This test can measure bone density in your hips, spine, and wrists. Osteopenia has no symptoms, so this condition is usually diagnosed after a routine bone density screening test is done for osteoporosis. This routine screening is usually done for:  Women who are age 25 or older.  Men who are age 38 or older. If you have risk factors for osteopenia, you may have the screening test at an earlier age. How is this treated? Making dietary and lifestyle changes can lower your risk for osteoporosis. If you have severe osteopenia that is close to becoming osteoporosis, this condition can be treated with medicines and dietary supplements such as calcium and vitamin D. These supplements help to rebuild bone density. Follow these instructions at home: Eating and drinking Eat a diet that is high in calcium and vitamin D.  Calcium is found in dairy products, beans, salmon, and leafy green vegetables like spinach and broccoli.  Look for foods that have vitamin D and calcium added to them (fortified foods), such as orange juice, cereal, and bread.   Lifestyle  Do 30 minutes or more of a weight-bearing exercise every day, such as walking, jogging, or playing a sport. These types of exercises strengthen the bones.  Do not use any products that contain nicotine or tobacco, such as cigarettes, e-cigarettes, and chewing tobacco. If you need help quitting, ask your health care provider.  Do not drink alcohol if: ? Your health care provider tells you not to drink. ? You are pregnant, may be pregnant, or are planning to become pregnant.  If you drink  alcohol: ? Limit how much you use to:  0-1 drink a day for women.  0-2 drinks a day for men. ? Be aware of how much alcohol is in your drink. In the [place] one drink equals one 12 oz bottle of beer (355 mL), one 5 oz glass of wine (148 mL), or one 1 oz glass of hard liquor (44 mL). General instructions  Take over-the-counter and prescription medicines only as told by your health care provider. These include vitamins and supplements.  Take precautions at home to lower your risk of falling, such as: ? Keeping rooms well-lit and free of clutter, such as cords. ? Installing safety rails on stairs. ? Using rubber mats in the  bathroom or other areas that are often wet or slippery.  Keep all follow-up visits. This is important. Contact a health care provider if:  You have not had a bone density screening for osteoporosis and you are: ? A woman who is age 33 or older. ? A man who is age 69 or older.  You are a postmenopausal woman who has not had a bone density screening for osteoporosis.  You are older than age 63 and you want to know if you should have bone density screening for osteoporosis. Summary  Osteopenia is a loss of thickness (density) inside the bones. Another name for osteopenia is low bone mass.  Osteopenia is not a disease, but it may increase your risk for a condition that causes the bones to become thin and break more easily (osteoporosis).  You may be at risk for osteopenia if you are older than age 29 or if you are a woman who went through early menopause.  Osteopenia does not cause any symptoms, but it can be diagnosed with a bone density screening test.  Dietary and lifestyle changes are the first treatment for osteopenia. These may lower your risk for osteoporosis. This information is not intended to replace advice given to you by your health care provider. Make sure you discuss any questions you have with your health care provider. Document Revised: 04/16/2020  Document Reviewed: 04/16/2020 Elsevier Patient Education  [place].

## 2021-03-04 NOTE — Progress Notes (Signed)
Subjective:     Teresa Mcknight is a 75 y.o. female and is here for a comprehensive physical exam.  Patient reports having labs done with podiatry, having bone density scan, and mammogram.  Pt requesting results from these visits be reviewed in detail.  Patient mentions occasional muscle spasm in right-side of neck.  At times also has supraclavicular/base of right neck edema that may last a day.  Patient inquires about referral to OB/GYN.  Patient mentions numbness in bottom of foot.  States podiatrist referred her to neurology for EMG/NCS.  Social History   Socioeconomic History  . Marital status: Single    Spouse name: Not on file  . Number of children: 0  . Years of education: Not on file  . Highest education level: Not on file  Occupational History  . Occupation: retired  Tobacco Use  . Smoking status: Never Smoker  . Smokeless tobacco: Never Used  Substance and Sexual Activity  . Alcohol use: No  . Drug use: No  . Sexual activity: Not on file  Other Topics Concern  . Not on file  Social History Narrative  . Not on file   Social Determinants of Health   Financial Resource Strain: Low Risk   . Difficulty of Paying Living Expenses: Not hard at all  Food Insecurity: No Food Insecurity  . Worried About Programme researcher, broadcasting/film/video in the Last Year: Never true  . Ran Out of Food in the Last Year: Never true  Transportation Needs: No Transportation Needs  . Lack of Transportation (Medical): No  . Lack of Transportation (Non-Medical): No  Physical Activity: Sufficiently Active  . Days of Exercise per Week: 5 days  . Minutes of Exercise per Session: 60 min  Stress: No Stress Concern Present  . Feeling of Stress : Not at all  Social Connections: Moderately Isolated  . Frequency of Communication with Friends and Family: More than three times a week  . Frequency of Social Gatherings with Friends and Family: More than three times a week  . Attends Religious Services: 1 to 4 times per year   . Active Member of Clubs or Organizations: No  . Attends Banker Meetings: Never  . Marital Status: Never married  Intimate Partner Violence: Not At Risk  . Fear of Current or Ex-Partner: No  . Emotionally Abused: No  . Physically Abused: No  . Sexually Abused: No   Health Maintenance  Topic Date Due  . Hepatitis C Screening  Never done  . TETANUS/TDAP  Never done  . MAMMOGRAM  02/05/2023  . COLONOSCOPY (Pts 45-35yrs Insurance coverage will need to be confirmed)  04/08/2029  . DEXA SCAN  Completed  . COVID-19 Vaccine  Completed  . HPV VACCINES  Aged Out  . INFLUENZA VACCINE  Discontinued  . PNA vac Low Risk Adult  Discontinued    The following portions of the patient's history were reviewed and updated as appropriate: allergies, current medications, past family history, past medical history, past social history, past surgical history and problem list.  Review of Systems Pertinent items noted in HPI and remainder of comprehensive ROS otherwise negative.   Objective:    BP 110/60 (BP Location: Left Arm, Patient Position: Sitting, Cuff Size: Normal)   Pulse 79   Temp 98.4 F (36.9 C) (Oral)   Ht 5' 3.5" (1.613 m)   SpO2 99%   BMI 19.46 kg/m  General appearance: alert, cooperative and no distress Head: Normocephalic, without obvious abnormality, atraumatic Eyes:  conjunctivae/corneas clear. PERRL, EOM's intact. Fundi benign. Ears: normal TM's and external ear canals both ears Nose: Nares normal. Septum midline. Mucosa normal. No drainage or sinus tenderness. Throat: lips, mucosa, and tongue normal; teeth and gums normal Neck: no adenopathy, no carotid bruit, no JVD, supple, symmetrical, trachea midline and thyroid not enlarged, symmetric, no tenderness/mass/nodules Lungs: clear to auscultation bilaterally Heart: regular rate and rhythm, S1, S2 normal, no murmur, click, rub or gallop Abdomen: soft, non-tender; bowel sounds normal; no masses,  no  organomegaly Extremities: extremities normal, atraumatic, no cyanosis or edema Pulses: 2+ and symmetric Skin: Skin color, texture, turgor normal. No rashes or lesions stud in midline upper sternum.  Hyperpigmentation external left ear Lymph nodes: Cervical, supraclavicular, and axillary nodes normal. Neurologic: Alert and oriented X 3, normal strength and tone. Normal symmetric reflexes. Normal coordination and gait    Assessment:    Healthy female exam.      Plan:     Anticipatory guidance given including wearing seatbelts, smoke detectors in the home, increasing physical activity, increasing p.o. intake of water and vegetables. -will obtain labs.  Pt declines BMP or CMP as recently done with podiatry.  Awaiting fax with these results. -Mammogram up-to-date, done 02/04/2021 and normal. -Colonoscopy done 04/09/2019.  No longer indicated -Bone density with osteopenia -Given handout -Next CPE in 1 year See After Visit Summary for Counseling Recommendations    Osteopenia, unspecified location -Noted on bone density from 02/04/2021 -Patient inquires about details of the report, however not available in chart. -Given information about osteopenia and advised to increase weightbearing exercise, calcium intake of 1200 IU and vitamin D intake of recent 800 IU daily  Numbness -Continue follow-up with podiatry and neurology for NCS/EMG - Plan: CBC with Differential/Platelet, Hemoglobin A1c, Vitamin B12  Encounter for hepatitis C screening test for low risk patient  - Plan: Hep C Antibody  Follow-up as needed  Abbe Amsterdam, MD

## 2021-03-05 ENCOUNTER — Telehealth: Payer: Self-pay | Admitting: Family Medicine

## 2021-03-05 LAB — HEMOGLOBIN A1C
Est. average glucose Bld gHb Est-mCnc: 111 mg/dL
Hgb A1c MFr Bld: 5.5 % (ref 4.8–5.6)

## 2021-03-05 LAB — CBC WITH DIFFERENTIAL/PLATELET
Basophils Absolute: 0 10*3/uL (ref 0.0–0.2)
Basos: 1 %
EOS (ABSOLUTE): 0.2 10*3/uL (ref 0.0–0.4)
Eos: 6 %
Hematocrit: 35.7 % (ref 34.0–46.6)
Hemoglobin: 12.3 g/dL (ref 11.1–15.9)
Immature Grans (Abs): 0 10*3/uL (ref 0.0–0.1)
Immature Granulocytes: 0 %
Lymphocytes Absolute: 1.3 10*3/uL (ref 0.7–3.1)
Lymphs: 30 %
MCH: 32.2 pg (ref 26.6–33.0)
MCHC: 34.5 g/dL (ref 31.5–35.7)
MCV: 94 fL (ref 79–97)
Monocytes Absolute: 0.4 10*3/uL (ref 0.1–0.9)
Monocytes: 9 %
Neutrophils Absolute: 2.3 10*3/uL (ref 1.4–7.0)
Neutrophils: 54 %
Platelets: 174 10*3/uL (ref 150–450)
RBC: 3.82 x10E6/uL (ref 3.77–5.28)
RDW: 13.2 % (ref 11.7–15.4)
WBC: 4.1 10*3/uL (ref 3.4–10.8)

## 2021-03-05 LAB — VITAMIN B12: Vitamin B-12: 1766 pg/mL — ABNORMAL HIGH (ref 232–1245)

## 2021-03-05 LAB — LIPID PANEL
Chol/HDL Ratio: 3.3 ratio (ref 0.0–4.4)
Cholesterol, Total: 188 mg/dL (ref 100–199)
HDL: 57 mg/dL (ref >39)
LDL Chol Calc (NIH): 118 mg/dL — ABNORMAL HIGH (ref 0–99)
Triglycerides: 68 mg/dL (ref 0–149)
VLDL Cholesterol Cal: 13 mg/dL (ref 5–40)

## 2021-03-05 LAB — HEPATITIS C ANTIBODY: Hep C Virus Ab: 0.3 s/co ratio (ref 0.0–0.9)

## 2021-03-05 NOTE — Telephone Encounter (Signed)
Banks patient. 

## 2021-03-05 NOTE — Telephone Encounter (Signed)
Pt call and stated she need a refill on acyclovir (ZOVIRAX) 400 MG tablet 90 days sent to  [id number] - [place], [place] - [address] AT [facility] OF [address] Phone:  [phone]  Fax:  [phone]

## 2021-03-09 NOTE — Progress Notes (Signed)
Patient viewed results on MyChart. 

## 2021-03-11 ENCOUNTER — Telehealth: Payer: Self-pay | Admitting: Family Medicine

## 2021-03-11 NOTE — Telephone Encounter (Signed)
Patient states she discussed at her last visit with Dr. Salomon Fick that she is taking Acyclovir.  She previously got it form her OB-GYN.  She now needs for Dr. Salomon Fick to prescribe it for her.  She needs a refill called in but wants a call back from Dr. Salomon Fick CMA.  She is very upset that this hasn't been done yet.  She states that she called the office after she left her appointment to have this refilled.

## 2021-03-12 ENCOUNTER — Other Ambulatory Visit: Payer: Self-pay

## 2021-03-12 DIAGNOSIS — A6000 Herpesviral infection of urogenital system, unspecified: Secondary | ICD-10-CM

## 2021-03-12 MED ORDER — ACYCLOVIR 400 MG PO TABS: 400.0000 mg | ORAL_TABLET | Freq: Three times a day (TID) | ORAL | 1 refills | Status: DC

## 2021-03-12 NOTE — Telephone Encounter (Signed)
Per Dr Salomon Fick, refilled Rx for patient.

## 2021-03-17 NOTE — Telephone Encounter (Signed)
Patient was seen on 4/21, but did not mention or ask for refill of medication, called office on 4/22 stating she needed a refill.has not had refill since Jan 2018 with Dr. [place]. Sent request to Dr. Salomon Fick, was not approved for refill until 4/28, refill was sent to pharmacy on 4/29.

## 2021-03-26 ENCOUNTER — Telehealth: Payer: Self-pay | Admitting: Family Medicine

## 2021-03-26 NOTE — Telephone Encounter (Signed)
The patient called wanting to talk to Dr. Salomon Fick about a medication that she needs.  Please advise

## 2021-03-26 NOTE — Telephone Encounter (Signed)
Patient should make an appointment to discuss.

## 2021-03-26 NOTE — Telephone Encounter (Signed)
Pt is calling in stating that she needs a prescription for malaria due [place].  Pt would like to have a call back.

## 2021-03-29 NOTE — Telephone Encounter (Signed)
Can we get pt scheduled for an appointment? Thank you!

## 2021-03-29 NOTE — Telephone Encounter (Signed)
I contacted the patient to schedule an appointment to discuss the malaria medication with Dr. Salomon Fick but the patient stated that she was just seen for her physical on 04/21 and feels that she shouldn't need another appointment for this medication.  Please advise

## 2021-03-29 NOTE — Telephone Encounter (Signed)
Patient was seen for her physical on 04/21

## 2021-03-30 ENCOUNTER — Telehealth: Payer: Self-pay | Admitting: Family Medicine

## 2021-03-30 NOTE — Telephone Encounter (Signed)
Pt is calling in stating that her Rx acyclovir (ZOVIRAX) 400 MG was called in for the incorrect quantity it should have been #90 instead of #30 and would to see if it can be corrected and sent in.  Pt is aware that her provider is not in the office today

## 2021-03-31 ENCOUNTER — Other Ambulatory Visit: Payer: Self-pay

## 2021-03-31 DIAGNOSIS — A6000 Herpesviral infection of urogenital system, unspecified: Secondary | ICD-10-CM

## 2021-03-31 MED ORDER — ACYCLOVIR 400 MG PO TABS: 400.0000 mg | ORAL_TABLET | Freq: Three times a day (TID) | ORAL | 2 refills | Status: DC

## 2021-03-31 NOTE — Telephone Encounter (Signed)
The patient called wanting to speak with Dr. Salomon Fick or her medical assistant about a medication the was sent in for her that has the incorrect quantity and to review her labs.  She said that this is like the 3rd or 4th message since 04/26   Please advise

## 2021-04-01 NOTE — Telephone Encounter (Signed)
Spoke with patient, advised about her lab results as Dr Salomon Fick sent them to her on MyChart and as she sent them to me. She understood. Patient then asked about mammogram, advised her with the questions she was asking, we did not have those detailed results.

## 2021-08-04 DIAGNOSIS — Z1231 Encounter for screening mammogram for malignant neoplasm of breast: Secondary | ICD-10-CM | POA: Diagnosis not present

## 2021-08-04 DIAGNOSIS — Z681 Body mass index (BMI) 19 or less, adult: Secondary | ICD-10-CM | POA: Diagnosis not present

## 2021-08-04 DIAGNOSIS — Z01419 Encounter for gynecological examination (general) (routine) without abnormal findings: Secondary | ICD-10-CM | POA: Diagnosis not present

## 2021-08-04 DIAGNOSIS — M8580 Other specified disorders of bone density and structure, unspecified site: Secondary | ICD-10-CM | POA: Diagnosis not present

## 2021-08-04 DIAGNOSIS — R600 Localized edema: Secondary | ICD-10-CM | POA: Diagnosis not present

## 2021-08-04 DIAGNOSIS — Z1211 Encounter for screening for malignant neoplasm of colon: Secondary | ICD-10-CM | POA: Diagnosis not present

## 2021-09-01 DIAGNOSIS — H40013 Open angle with borderline findings, low risk, bilateral: Secondary | ICD-10-CM | POA: Diagnosis not present

## 2021-09-08 ENCOUNTER — Other Ambulatory Visit: Payer: Self-pay

## 2021-09-08 DIAGNOSIS — A6000 Herpesviral infection of urogenital system, unspecified: Secondary | ICD-10-CM

## 2021-09-08 MED ORDER — ACYCLOVIR 400 MG PO TABS: 400.0000 mg | ORAL_TABLET | Freq: Three times a day (TID) | ORAL | 2 refills | Status: DC

## 2021-09-13 DIAGNOSIS — M25511 Pain in right shoulder: Secondary | ICD-10-CM | POA: Diagnosis not present

## 2021-09-13 DIAGNOSIS — M25551 Pain in right hip: Secondary | ICD-10-CM | POA: Diagnosis not present

## 2021-10-15 DIAGNOSIS — M8580 Other specified disorders of bone density and structure, unspecified site: Secondary | ICD-10-CM | POA: Diagnosis not present

## 2021-10-15 DIAGNOSIS — Z86010 Personal history of colonic polyps: Secondary | ICD-10-CM | POA: Diagnosis not present

## 2021-10-15 DIAGNOSIS — E785 Hyperlipidemia, unspecified: Secondary | ICD-10-CM | POA: Diagnosis not present

## 2021-10-15 DIAGNOSIS — K5909 Other constipation: Secondary | ICD-10-CM | POA: Diagnosis not present

## 2021-11-03 DIAGNOSIS — E789 Disorder of lipoprotein metabolism, unspecified: Secondary | ICD-10-CM | POA: Insufficient documentation

## 2021-11-03 DIAGNOSIS — N952 Postmenopausal atrophic vaginitis: Secondary | ICD-10-CM | POA: Insufficient documentation

## 2021-11-03 DIAGNOSIS — D513 Other dietary vitamin B12 deficiency anemia: Secondary | ICD-10-CM | POA: Insufficient documentation

## 2021-11-03 DIAGNOSIS — M8580 Other specified disorders of bone density and structure, unspecified site: Secondary | ICD-10-CM | POA: Insufficient documentation

## 2021-11-03 DIAGNOSIS — D259 Leiomyoma of uterus, unspecified: Secondary | ICD-10-CM | POA: Insufficient documentation

## 2021-11-10 DIAGNOSIS — M8580 Other specified disorders of bone density and structure, unspecified site: Secondary | ICD-10-CM | POA: Diagnosis not present

## 2021-11-16 ENCOUNTER — Ambulatory Visit: Payer: Medicare PPO

## 2022-04-26 DIAGNOSIS — G8929 Other chronic pain: Secondary | ICD-10-CM | POA: Insufficient documentation

## 2022-04-27 ENCOUNTER — Other Ambulatory Visit: Payer: Self-pay | Admitting: Orthopedic Surgery

## 2022-04-27 DIAGNOSIS — M25562 Pain in left knee: Secondary | ICD-10-CM

## 2022-05-11 ENCOUNTER — Ambulatory Visit
Admission: RE | Admit: 2022-05-11 | Discharge: 2022-05-11 | Disposition: A | Discharge status: Home / Self Care | Payer: Medicare PPO | Source: Ambulatory Visit | Attending: Orthopedic Surgery | Admitting: Orthopedic Surgery

## 2022-05-11 DIAGNOSIS — G8929 Other chronic pain: Secondary | ICD-10-CM

## 2022-09-20 ENCOUNTER — Telehealth: Payer: Self-pay | Admitting: Cardiovascular Disease

## 2022-09-20 NOTE — Telephone Encounter (Signed)
Patient would like to transfer from Dr. Oval Linsey to Dr. Ellyn Hack. Please confirm transfer

## 2022-09-21 NOTE — Telephone Encounter (Signed)
Any reason - ??  He only saw her one time.  DH

## 2022-09-24 ENCOUNTER — Other Ambulatory Visit: Payer: Self-pay | Admitting: Family Medicine

## 2022-09-24 DIAGNOSIS — A6000 Herpesviral infection of urogenital system, unspecified: Secondary | ICD-10-CM

## 2022-09-27 ENCOUNTER — Ambulatory Visit: Payer: Medicare PPO

## 2022-09-27 ENCOUNTER — Encounter: Payer: Self-pay | Admitting: Cardiology

## 2022-09-27 ENCOUNTER — Ambulatory Visit: Payer: Medicare PPO | Attending: Cardiology | Admitting: Cardiology

## 2022-09-27 VITALS — BP 118/56 | HR 71 | Ht 65.5 in | Wt 117.0 lb

## 2022-09-27 DIAGNOSIS — R002 Palpitations: Secondary | ICD-10-CM

## 2022-09-27 DIAGNOSIS — M25472 Effusion, left ankle: Secondary | ICD-10-CM | POA: Insufficient documentation

## 2022-09-27 NOTE — Telephone Encounter (Signed)
Appointment already made for 09/27/22?

## 2022-09-27 NOTE — Progress Notes (Signed)
Primary Care Provider: Billie Ruddy, [place] Cardiologist: Glenetta Hew, MD Electrophysiologist: None  Clinic Note: Chief Complaint  Patient presents with   New Patient (Initial Visit)    Irregular heartbeats  ===================================  ASSESSMENT/PLAN   Problem List Items Addressed This Visit     Left ankle swelling    Unilateral swelling with some skin discoloration.  Since this is a recurrent thing for her I would want to exclude venous stasis but also potential DVT since it is unilateral.  Plan: Lower extremity venous Dopplers for DVT and reflux.      Relevant Orders   VAS [place] LOWER EXTREMITY VENOUS REFLUX   Palpitations - Primary    Sounds like she is having PVCs or PACs with the pounding sensation being the post PVC or PAC beat.  Would like to see what her burden is.  She is also [place] last healthy long to be simply PACs and PVCs.  Could be bigeminy or trigeminy.  Not happening that frequently.  Plan: 14-day Zio patch  Also stressed importance of adequate hydration and avoiding triggers.      Relevant Orders   LONG TERM MONITOR (3-14 DAYS)   EKG 12-Lead   Plan: 7-day Zio patch monitor and lower extremity venous Dopplers.  Follow-up in 2 months.   ===================================  HPI:    Teresa Mcknight is a 76 y.o. female who is being seen today for the evaluation of FORCEFUL HEARTBEATS/PALPITATIONS and LEG SWELLING at the request of Billie Ruddy, MD.  Teresa Mcknight was seen by Dr. Oval Linsey back in January 2020 for similar episodes of feeling like her heart rate was beating hard.  Often waking up from sleep.  Not racing or not necessarily irregular.  Sometimes feels upper throat as though she is gagging.  They were not occur very frequently.  Maybe a couple times a month.  No associated chest pain dyspnea or lightheadedness.  She also noted feeling symptoms that she described as palpitations feeling that her heart is beating  fast with exertion.  Not exacerbated by exertion.  At that time was eating a vegan diet for least 5 years.  Mild end of day edema.  2 cups of tea but no coffee.  No sodas.  Apparently had a history of a normal echo in 2008.  Also the ETT that was normal.  Thyroid-T3 and T4 checked, and were unremarkable.  (Bruits heard-carotid Dopplers ordere, but not done).  Recent Hospitalizations: None  Reviewed  CV studies:    The following studies were reviewed today: (if available, images/films reviewed: From Epic Chart or Care Everywhere) None:  Interval History:   Teresa Mcknight presents today with a complaint of feeling her heart beating funny pounding pulses more so than fat.  She feels some irregularity to have a just these forceful pounding beats which may make her feel lightheaded and dizzy when is going on for a few minutes but does not last all that long.  Maybe 1 to 2 minutes at the most.  She feels though she is got feel tired and maybe go to sleep.  She notes these episodes when she is settling down at the end of the day after hard day at work.  Can sometimes have it out after exercise.  Every now that happens in the middle of the day but not usually.  She does not say that her heart rate is going fast, just pounding forceful.  She did not describe it as a discomfort,  just unusual sensation.  Not associated any syncope or near syncope just some lightheadedness.  She has some mild end of day swelling in the left leg more than the right that does not necessarily go down in the morning.  CV Review of Symptoms (Summary): no chest pain or dyspnea on exertion positive for - edema, irregular heartbeat, palpitations, and these are described as forceful palpitations with episodes lasting anywhere from a minute or 2.  Associated lightheadedness and dizziness but no syncope or near syncope. negative for - loss of consciousness, orthopnea, paroxysmal nocturnal dyspnea, rapid heart rate, shortness of breath,  or TIA or amaurosis fugax.  Claudication  REVIEWED OF SYSTEMS   Review of Systems  Constitutional:  Negative for malaise/fatigue and weight loss.  Respiratory:  Negative for cough and shortness of breath.   Gastrointestinal:  Negative for blood in stool and melena.  Genitourinary:  Negative for hematuria.  Musculoskeletal:  Negative for back pain and joint pain.  Neurological:  Positive for dizziness.  Psychiatric/Behavioral:  Negative for depression and memory loss. The patient is nervous/anxious.    I have reviewed and (if needed) personally updated the patient's problem list, medications, allergies, past medical and surgical history, social and family history.   PAST MEDICAL HISTORY   Past Medical History:  Diagnosis Date   Heart murmur    Palpitations 11/21/2018   Pancreatitis     PAST SURGICAL HISTORY   Past Surgical History:  Procedure Laterality Date   COLONOSCOPY  2020   Dr Audery [facility]    Immunization History  Administered Date(s) Administered   PFIZER(Purple Top)SARS-COV-2 Vaccination 01/05/2020, 01/28/2020, 09/12/2020    MEDICATIONS/ALLERGIES   Current Meds  Medication Sig   acyclovir (ZOVIRAX) 400 MG tablet Take 1 tablet (400 mg total) by mouth 3 (three) times daily. Prn for flare ups,start < 48 hrs after sx onset and for 5 days.   bimatoprost (LATISSE) 0.03 % ophthalmic solution APPLY SOLUTION TOPICALLY INTO EACH EYE AT BEDTIME   Cholecalciferol (VITAMIN D3) 50 MCG (2000 UT) TABS Take 2,000 Units by mouth at bedtime.   Multiple Vitamin (MULTI-VITAMIN) tablet Take 1 tablet by mouth daily.   VITAMIN B1-B12 PO Take 5,000 mg by mouth at bedtime.     Allergies  Allergen Reactions   Aspirin Other (See Comments)    Sore stomach   Dairy Aid [Tilactase]     Causes Mucus   Latex Itching   Valtrex [Valacyclovir Hcl] Nausea And Vomiting    SOCIAL HISTORY/FAMILY HISTORY   Reviewed in Epic:   Social History   Tobacco Use   Smoking status: Never    Smokeless tobacco: Never  Substance Use Topics   Alcohol use: No   Drug use: No   Social History   Social History Narrative   Not on file  No coffee -- ~ 1-2 cups tea per day.  No sodas    Family History  Problem Relation Age of Onset   Dementia Mother    Stroke Mother    Stroke Father    Multiple myeloma Sister    Multiple myeloma Brother    Dementia Maternal Grandmother    Hypothyroidism Brother    Hyperthyroidism Sister    Cancer Neg Hx    Diabetes Neg Hx    Colon cancer Neg Hx   - M 7 D - TIAs in 90s.    OBJCTIVE -PE, EKG, labs   Wt Readings from Last 3 Encounters:  09/27/22 117 lb (53.1 kg)  03/27/20  111 lb 9.6 oz (50.6 kg)  02/13/20 111 lb (50.3 kg)    Physical Exam: BP (!) 118/56 (BP Location: Left Arm, Patient Position: Sitting, Cuff Size: Normal)   Pulse 71   Ht 5' 5.5" (1.664 m)   Wt 117 lb (53.1 kg)   SpO2 99%   BMI 19.17 kg/m  Physical Exam Vitals reviewed.  Constitutional:      General: She is not in acute distress.    Appearance: Normal appearance. She is normal weight. She is not ill-appearing or toxic-appearing.  HENT:     Head: Normocephalic and atraumatic.  Neck:     Vascular: No carotid bruit or JVD.  Cardiovascular:     Rate and Rhythm: Normal rate and regular rhythm. Occasional Extrasystoles are present.    Chest Wall: PMI is not displaced.     Pulses: Normal pulses.     Heart sounds: S1 normal and S2 normal.     No friction rub. No gallop.  Abdominal:     Tenderness: There is no abdominal tenderness.  Musculoskeletal:     Cervical back: Normal range of motion and neck supple.  Neurological:     Mental Status: She is alert.      Adult ECG Report  Rate: 71 ;  Rhythm: normal sinus rhythm and normal axis, intervals and durations. ;   Narrative Interpretation: Normal  Recent Labs:  reviewed   02/24/2022: TC 151, TG 57, HDL 50, LDL 88.  A1c not checked.  Hgb 12.3. Lab Results  Component Value Date   CHOL 188 03/04/2021   HDL  57 03/04/2021   LDLCALC 118 (H) 03/04/2021   TRIG 68 03/04/2021   CHOLHDL 3.3 03/04/2021   Lab Results  Component Value Date   CREATININE 0.95 02/13/2020   BUN 5 (L) 02/13/2020   NA 140 02/13/2020   K 3.9 02/13/2020   CL 101 02/13/2020   CO2 30 02/13/2020      Latest Ref Rng & Units 03/04/2021   12:24 PM 03/04/2021   12:00 AM 02/13/2020   10:07 AM  CBC  WBC 3.4 - 10.8 x10E3/uL 4.1   3.0   Hemoglobin 11.1 - 15.9 g/dL 12.3   12.9   Hematocrit 34.0 - 46.6 % 35.7  36     37.8   Platelets 150 - 450 x10E3/uL 174  174     174.0      This result is from an external source.    Lab Results  Component Value Date   HGBA1C 5.5 03/04/2021   Lab Results  Component Value Date   TSH 2.030 11/21/2018    ================================================== I spent a total of 22  minutes with the patient spent in direct patient consultation.  Additional time spent with chart review  / charting (studies, outside notes, etc): 20 min Total Time: 42 min  Current medicines are reviewed at length with the patient today.  (+/- concerns) none  Notice: This dictation was prepared with Dragon dictation along with smart phrase technology. Any transcriptional errors that result from this process are unintentional and may not be corrected upon review.   Studies Ordered:  Orders Placed This Encounter  Procedures   LONG TERM MONITOR (3-14 DAYS)   EKG 12-Lead   VAS [place] LOWER EXTREMITY VENOUS REFLUX   No orders of the defined types were placed in this encounter.   Patient Instructions / Medication Changes & Studies & Tests Ordered   Patient Instructions  Medication Instructions:   No changes  *  If you need a refill on your cardiac medications before your next appointment, please call your pharmacy*   Lab Work: Not needed    Testing/Procedures:  Will be mailed to you in 3 to 5 days Your physician has recommended that you wear a holter monitor 14 day ZIO . Holter monitors are medical  devices that record the heart's electrical activity. Doctors most often use these monitors to diagnose arrhythmias. Arrhythmias are problems with the speed or rhythm of the heartbeat. The monitor is a small, portable device. You can wear one while you do your normal daily activities. This is usually used to diagnose what is causing palpitations/syncope (passing out).  And  Schedule at [facility] has requested that you have a lower extremity venous duplex( reflux/DVT. This test is an ultrasound of the veins in the legs . It looks at venous blood flow that carries blood from the heart to the legs . Allow one hour for a Lower Venous exam. There are no restrictions or special instructions.     Follow-Up: At [facility], you and your health needs are our priority.  As part of our continuing mission to provide you with exceptional heart care, we have created designated Provider Care Teams.  These Care Teams include your primary Cardiologist (physician) and Advanced Practice Providers (APPs -  Physician Assistants and Nurse Practitioners) who all work together to provide you with the care you need, when you need it.     Your next appointment:   2 month(s)  The format for your next appointment:   In Person  Provider:   Glenetta Hew, MD    Other Instructions  ZIO XT- Long Term Monitor Instructions  Your physician has requested you wear a ZIO patch monitor for 14 days.  This is a single patch monitor. [employer] supplies one patch monitor per enrollment. Additional stickers are not available. Please do not apply patch if you will be having a Nuclear Stress Test,  Echocardiogram, Cardiac CT, MRI, or Chest Xray during the period you would be wearing the  monitor. The patch cannot be worn during these tests. You cannot remove and re-apply the  ZIO XT patch monitor.  Your ZIO patch monitor will be mailed 3 day USPS to your address on file. It may take 3-5 days   to receive your monitor after you have been enrolled.  Once you have received your monitor, please review the enclosed instructions. Your monitor  has already been registered assigning a specific monitor serial # to you.  Billing and Patient Assistance Program Information  We have supplied [employer] with any of your insurance information on file for billing purposes. [employer] offers a sliding scale Patient Assistance Program for patients that do not have  insurance, or whose insurance does not completely cover the cost of the ZIO monitor.  You must apply for the Patient Assistance Program to qualify for this discounted rate.  To apply, please call [employer] at [phone], select option 4, select option 2, ask to apply for  Patient Assistance Program. Theodore Demark will ask your household income, and how many people  are in your household. They will quote your out-of-pocket cost based on that information.  [employer] will also be able to set up a 53-month interest-free payment plan if needed.  Applying the monitor   Shave hair from upper left chest.  Hold abrader disc by orange tab. Rub abrader in 40 strokes over the upper left chest as  indicated  in your monitor instructions.  Clean area with 4 enclosed alcohol pads. Let dry.  Apply patch as indicated in monitor instructions. Patch will be placed under collarbone on left  side of chest with arrow pointing upward.  Rub patch adhesive wings for 2 minutes. Remove white label marked "1". Remove the white  label marked "2". Rub patch adhesive wings for 2 additional minutes.  While looking in a mirror, press and release button in center of patch. A small green light will  flash 3-4 times. This will be your only indicator that the monitor has been turned on.  Do not shower for the first 24 hours. You may shower after the first 24 hours.  Press the button if you feel a symptom. You will hear a small click. Record Date, Time and  Symptom in the Patient  Logbook.  When you are ready to remove the patch, follow instructions on the last 2 pages of Patient  Logbook. Stick patch monitor onto the last page of Patient Logbook.  Place Patient Logbook in the blue and white box. Use locking tab on box and tape box closed  securely. The blue and white box has prepaid postage on it. Please place it in the mailbox as  soon as possible. Your physician should have your test results approximately 7 days after the  monitor has been mailed back to [facility].  Call [facility] at [phone] if you have questions regarding  your ZIO XT patch monitor. Call them immediately if you see an orange light blinking on your  monitor.  If your monitor falls off in less than 4 days, contact our Monitor department at [phone].  If your monitor becomes loose or falls off after 4 days call [employer] at [phone] for  suggestions on securing your monitor     Leonie Man, MD, MS Glenetta Hew, M.D., M.S. Interventional Cardiologist  [facility]  Pager # [phone] Phone # [phone] [address] [place], [place] [address]   Thank you for choosing [facility]!!

## 2022-09-27 NOTE — Patient Instructions (Signed)
Medication Instructions:   No changes  *If you need a refill on your cardiac medications before your next appointment, please call your pharmacy*   Lab Work: Not needed    Testing/Procedures:  Will be mailed to you in 3 to 5 days Your physician has recommended that you wear a holter monitor 14 day ZIO . Holter monitors are medical devices that record the heart's electrical activity. Doctors most often use these monitors to diagnose arrhythmias. Arrhythmias are problems with the speed or rhythm of the heartbeat. The monitor is a small, portable device. You can wear one while you do your normal daily activities. This is usually used to diagnose what is causing palpitations/syncope (passing out).  And  Schedule at [address] Your physician has requested that you have a lower extremity venous duplex( reflux/DVT. This test is an ultrasound of the veins in the legs . It looks at venous blood flow that carries blood from the heart to the legs . Allow one hour for a Lower Venous exam. There are no restrictions or special instructions.     Follow-Up: At [facility], you and your health needs are our priority.  As part of our continuing mission to provide you with exceptional heart care, we have created designated Provider Care Teams.  These Care Teams include your primary Cardiologist (physician) and Advanced Practice Providers (APPs -  Physician Assistants and Nurse Practitioners) who all work together to provide you with the care you need, when you need it.     Your next appointment:   2 month(s)  The format for your next appointment:   In Person  Provider:   Bryan Lemma, MD    Other Instructions  ZIO XT- Long Term Monitor Instructions  Your physician has requested you wear a ZIO patch monitor for 14 days.  This is a single patch monitor. [employer] supplies one patch monitor per enrollment. Additional stickers are not available. Please do not apply patch if  you will be having a Nuclear Stress Test,  Echocardiogram, Cardiac CT, MRI, or Chest Xray during the period you would be wearing the  monitor. The patch cannot be worn during these tests. You cannot remove and re-apply the  ZIO XT patch monitor.  Your ZIO patch monitor will be mailed 3 day USPS to your address on file. It may take 3-5 days  to receive your monitor after you have been enrolled.  Once you have received your monitor, please review the enclosed instructions. Your monitor  has already been registered assigning a specific monitor serial # to you.  Billing and Patient Assistance Program Information  We have supplied [employer] with any of your insurance information on file for billing purposes. [employer] offers a sliding scale Patient Assistance Program for patients that do not have  insurance, or whose insurance does not completely cover the cost of the ZIO monitor.  You must apply for the Patient Assistance Program to qualify for this discounted rate.  To apply, please call [employer] at [phone], select option 4, select option 2, ask to apply for  Patient Assistance Program. Meredeth Ide will ask your household income, and how many people  are in your household. They will quote your out-of-pocket cost based on that information.  [employer] will also be able to set up a 72-month, interest-free payment plan if needed.  Applying the monitor   Shave hair from upper left chest.  Hold abrader disc by orange tab. Rub abrader in 40 strokes over the  upper left chest as  indicated in your monitor instructions.  Clean area with 4 enclosed alcohol pads. Let dry.  Apply patch as indicated in monitor instructions. Patch will be placed under collarbone on left  side of chest with arrow pointing upward.  Rub patch adhesive wings for 2 minutes. Remove white label marked "1". Remove the white  label marked "2". Rub patch adhesive wings for 2 additional minutes.  While looking in a mirror, press and  release button in center of patch. A small green light will  flash 3-4 times. This will be your only indicator that the monitor has been turned on.  Do not shower for the first 24 hours. You may shower after the first 24 hours.  Press the button if you feel a symptom. You will hear a small click. Record Date, Time and  Symptom in the Patient Logbook.  When you are ready to remove the patch, follow instructions on the last 2 pages of Patient  Logbook. Stick patch monitor onto the last page of Patient Logbook.  Place Patient Logbook in the blue and white box. Use locking tab on box and tape box closed  securely. The blue and white box has prepaid postage on it. Please place it in the mailbox as  soon as possible. Your physician should have your test results approximately 7 days after the  monitor has been mailed back to [facility].  Call [facility] Customer Care at [phone] if you have questions regarding  your ZIO XT patch monitor. Call them immediately if you see an orange light blinking on your  monitor.  If your monitor falls off in less than 4 days, contact our Monitor department at [phone].  If your monitor becomes loose or falls off after 4 days call [employer] at [phone] for  suggestions on securing your monitor

## 2022-09-27 NOTE — Progress Notes (Unsigned)
Enrolled for [employer] to mail a ZIO XT long term holter monitor to the patients address on file.  

## 2022-10-01 ENCOUNTER — Encounter: Payer: Self-pay | Admitting: Cardiology

## 2022-10-01 NOTE — Assessment & Plan Note (Signed)
Unilateral swelling with some skin discoloration.  Since this is a recurrent thing for her I would want to exclude venous stasis but also potential DVT since it is unilateral.  Plan: Lower extremity venous Dopplers for DVT and reflux.

## 2022-10-01 NOTE — Assessment & Plan Note (Addendum)
Sounds like she is having PVCs or PACs with the pounding sensation being the post PVC or PAC beat.  Would like to see what her burden is.  She is also [place] last healthy long to be simply PACs and PVCs.  Could be bigeminy or trigeminy.  Not happening that frequently.  Plan: 14-day Zio patch  Also stressed importance of adequate hydration and avoiding triggers.

## 2022-10-11 ENCOUNTER — Telehealth: Payer: Self-pay | Admitting: *Deleted

## 2022-10-11 NOTE — Telephone Encounter (Signed)
Patients monitor returned with only one hour of data.  Attempting to contact patient to arrange a redo to be applied at our office.  Patient has appointment at [facility] on 12/4.  LMVM Please call Jeramia Saleeby in monitors at [phone].

## 2022-10-12 ENCOUNTER — Encounter (HOSPITAL_COMMUNITY): Payer: Medicare PPO

## 2022-10-12 ENCOUNTER — Encounter: Payer: Self-pay | Admitting: Cardiology

## 2022-10-12 NOTE — Telephone Encounter (Signed)
Error

## 2022-10-12 NOTE — Telephone Encounter (Signed)
Patient returned call. She had an immediate reaction to the ZIO Patch monitor and removed it within minutes of application. [employer] had contacted patient with out of pocket quote stating medicare would pick up about $400 dollars of the cost and she would be responsible for $500+ balance.   Patient told [employer] she did not want to do the test and she had already returned the monitor. I contacted Murriel Hopper at [facility] to confirm patient would not be charged for monitor.  Order cancelled.

## 2022-10-12 NOTE — Telephone Encounter (Signed)
That somewhat of a bummer. I guess we can try one of the other markers for couple weeks. Otherwise we can just just [facility]  Bryan Lemma, MD

## 2022-10-17 ENCOUNTER — Ambulatory Visit (HOSPITAL_COMMUNITY)
Admission: RE | Admit: 2022-10-17 | Discharge: 2022-10-17 | Disposition: A | Discharge status: Home / Self Care | Payer: Medicare PPO | Source: Ambulatory Visit | Attending: Cardiology | Admitting: Cardiology

## 2022-10-17 DIAGNOSIS — S6991XA Unspecified injury of right wrist, hand and finger(s), initial encounter: Secondary | ICD-10-CM | POA: Insufficient documentation

## 2022-10-17 DIAGNOSIS — M25472 Effusion, left ankle: Secondary | ICD-10-CM | POA: Diagnosis not present

## 2022-10-25 ENCOUNTER — Telehealth: Payer: Self-pay | Admitting: *Deleted

## 2022-10-25 NOTE — Telephone Encounter (Signed)
-----   Message from Marykay Lex, MD sent at 10/19/2022 10:52 PM EST ----- Evaluation of the left leg swelling 2 different findings. . There is indeed vein reflux of the superficial vein (left greater saphenous vein) in the thigh. 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 => Recommend orthopedic evaluation.v(would defer to PCP / patient preference of Orthopedic Surgeon.  We can discuss the superficial vein reflux in follow-up, but the Baker's cyst is somewhat is not managed by cardiovascular medicine.  Bryan Lemma, MD

## 2022-10-25 NOTE — Telephone Encounter (Signed)
The patient has been notified of the result and verbalized understanding.  All questions (if any) were answered. Tobin Chad, RN 10/25/2022 2:41 PM   patient states she has a orthopedic surgeon Dr Sherlean Foot. She states she had surgery on left knee in June 2023. She states she was aware she had baker cyst  but it would dissolve . Patient would like a copy of report sent to Dr Tobin Chad office  . RN informed patient to contact  Dr Sherlean Foot 's office  to see if treatment is neded.   RN Routed report.

## 2022-12-05 ENCOUNTER — Telehealth: Payer: Self-pay | Admitting: Cardiology

## 2022-12-05 NOTE — Telephone Encounter (Signed)
Patient would like a call back regarding her doppler f/u from [employer].

## 2022-12-05 NOTE — Telephone Encounter (Signed)
Spoke to patient who states she was just calling to see if she can move up appointment that got cancelled. Scheduled patient to see Dr. Ellyn Hack on 12/23/22.   Advised patient to call back to office with any issues, questions, or concerns. Patient verbalized understanding.

## 2022-12-13 ENCOUNTER — Ambulatory Visit: Payer: Medicare PPO | Admitting: Cardiology

## 2022-12-23 ENCOUNTER — Encounter: Payer: Self-pay | Admitting: Cardiology

## 2022-12-23 ENCOUNTER — Ambulatory Visit: Payer: Medicare PPO | Attending: Cardiology | Admitting: Cardiology

## 2022-12-23 VITALS — BP 108/58 | HR 87 | Ht 65.5 in | Wt 115.6 lb

## 2022-12-23 DIAGNOSIS — R002 Palpitations: Secondary | ICD-10-CM | POA: Diagnosis not present

## 2022-12-23 DIAGNOSIS — M25472 Effusion, left ankle: Secondary | ICD-10-CM | POA: Diagnosis not present

## 2022-12-23 NOTE — Progress Notes (Signed)
Primary Care Provider: Michael Boston, MD [place] Cardiologist: Glenetta Hew, MD Electrophysiologist: None  Clinic Note: Chief Complaint  Patient presents with   Follow-up    Test results-Zio patch.     Palpitations    Palpitations improved   ===================================  ASSESSMENT/PLAN   Problem List Items Addressed This Visit     Palpitations - Primary (Chronic)    Thankfully, her episodes seem to diminished significantly.  Relatively short-lived and only 1 or 2 episodes in the last 3 months.  She did not tolerate a Zio patch and does not want to try another monitor.  I think at this point as infrequently as they are occurring, were probably better off of using Kardia-Mobile.  She will look into it if the symptoms recur. Otherwise we discussed avoiding triggers-staying hydrated and exercising.      Left ankle swelling    DVT Dopplers performed, no DVT but there was evidence of reflux and a Baker's cyst.  Was referred back to orthopedics.  If she does have recurrent swelling in the leg potentially consider support stockings, and if significant could consider referral to [facility].       ===================================  HPI:    Teresa Mcknight is a 77 y.o. female with a minimal CV PMH reviewed below who presents today for 1-monthfollow-up to discuss results of studies..Mercie Eonwas initially seen by Dr. ROval Linseyin January 2020 for sensations of a irregular/forceful heartbeats.  Often waking up from sleep.  => ETT work, normal.  Had not been seen back until I saw her on September 27, 2022 for similar symptoms at the request of SGrier Mitts MD.  She began noticing a strange forceful/pounding heartbeat associated with lightheadedness and dizziness.  Lasted few minutes.  At the most 2 minutes.  Episodes are not associated with rapid heartbeats, just for small irregular heartbeat.  Episodes usually occurring at the end of day when trying to.  No  associated chest pain or pressure.  No association with exertion.  Also noted left ankle swelling Ordered lower extremity venous reflux for left ankle swelling along with 14-day ZIO  Recent Hospitalizations:  None  Reviewed  CV studies:    The following studies were reviewed today: (if available, images/films reviewed: From Epic Chart or Care Everywhere) Lower extremity Dopplers 10/17/2022: Left GSV short segment reflux noted on the left.  Mild to moderate suprapatellar fluid noted (complex Baker's cyst).  Interval History:   LMadyline Dickereturns here today stating that she been doing relatively well since last visit but has had maybe 3 spells since the last visit where she is had irregular heartbeat.  These episodes last maybe a minute or 2 and they usually break with deep breathing.  She does not have any symptoms of lightheadedness or dizziness associated with them.  No chest pain or pressure.  She had a reaction to the Zio patch monitor adhesive.  It actually caused a skin burn.  The patch was removed and sent back to the company.  Order canceled.  I recommended berry using Kardia-Mobile if symptoms recur.  She actually did not have time to capture these episodes on Kardia-Mobile.  CV Review of Symptoms (Summary):no chest pain or dyspnea on exertion positive for - irregular heartbeat, palpitations, and episodes of short-lived, more forceful heartbeats and details.  No other associated symptoms. negative for - edema, orthopnea, paroxysmal nocturnal dyspnea, rapid heart rate, shortness of breath, or lightheadedness, dizziness or wooziness, syncope/near syncope or TIA/amaurosis  fugax, claudication  REVIEWED OF SYSTEMS   Review of Systems  Constitutional:  Negative for malaise/fatigue and weight loss.  HENT:  Negative for congestion.   Respiratory:  Negative for shortness of breath.   Cardiovascular:  Negative for chest pain and claudication.  Gastrointestinal:  Positive for blood in  stool.  Genitourinary:  Negative for hematuria.  Musculoskeletal:  Negative for joint pain.  Neurological:  Negative for dizziness.  Psychiatric/Behavioral:  Negative for memory loss. The patient does not have insomnia.     I have reviewed and (if needed) personally updated the patient's problem list, medications, allergies, past medical and surgical history, social and family history.   PAST MEDICAL HISTORY   Past Medical History:  Diagnosis Date   Heart murmur    Palpitations 11/21/2018   Pancreatitis     PAST SURGICAL HISTORY   Past Surgical History:  Procedure Laterality Date   COLONOSCOPY  2020   Dr Audery [facility]   MEDICATIONS/ALLERGIES   Current Meds  Medication Sig   acyclovir (ZOVIRAX) 400 MG tablet TAKE 1 TABLET THREE TIMES DAILY AS NEEDED FOR FLARE-UPS. START WITHIN 48 HRS OF SYMPTOMS ONSET AND CONTINUE FOR 5 DAYS.   Ascorbic Acid (VITA-C PO) Take 1 capsule by mouth daily at 6 (six) AM.   bimatoprost (LATISSE) 0.03 % ophthalmic solution APPLY SOLUTION TOPICALLY INTO EACH EYE AT BEDTIME   Multiple Vitamin (MULTI-VITAMIN) tablet Take 1 tablet by mouth daily.    Allergies  Allergen Reactions   Aspirin Other (See Comments)    Sore stomach   Dairy Aid [Tilactase]     Causes Mucus   Latex Itching   Valtrex [Valacyclovir Hcl] Nausea And Vomiting    SOCIAL HISTORY/FAMILY HISTORY   Reviewed in Epic:  Pertinent findings:  Social History   Tobacco Use   Smoking status: Never   Smokeless tobacco: Never  Substance Use Topics   Alcohol use: No   Drug use: No   Social History   Social History Narrative   Not on file    OBJCTIVE -PE, EKG, labs   Wt Readings from Last 3 Encounters:  12/23/22 115 lb 9.6 oz (52.4 kg)  09/27/22 117 lb (53.1 kg)  03/27/20 111 lb 9.6 oz (50.6 kg)    Physical Exam: BP (!) 108/58   Pulse 87   Ht 5' 5.5" (1.664 m)   Wt 115 lb 9.6 oz (52.4 kg)   SpO2 98%   BMI 18.94 kg/m  Physical Exam Vitals reviewed.   Constitutional:      General: She is not in acute distress.    Appearance: Normal appearance. She is normal weight. She is not ill-appearing (Well-nourished, well-groomed.  Healthy) or toxic-appearing.  HENT:     Head: Normocephalic and atraumatic.  Neck:     Vascular: No carotid bruit.  Cardiovascular:     Rate and Rhythm: Normal rate and regular rhythm. Occasional Extrasystoles are present.    Chest Wall: PMI is not displaced.     Pulses: Normal pulses.     Heart sounds: Normal heart sounds, S1 normal and S2 normal. No murmur heard.    No friction rub. No gallop.  Pulmonary:     Effort: Pulmonary effort is normal. No respiratory distress.     Breath sounds: Normal breath sounds. No wheezing, rhonchi or rales.  Musculoskeletal:        General: No swelling. Normal range of motion.     Cervical back: Normal range of motion and neck supple.  Skin:    General: Skin is warm and dry.  Neurological:     General: No focal deficit present.     Mental Status: She is alert and oriented to person, place, and time.     Cranial Nerves: No cranial nerve deficit.     Gait: Gait normal.  Psychiatric:        Mood and Affect: Mood normal.        Thought Content: Thought content normal.        Judgment: Judgment normal.      Adult ECG Report N/A  Recent Labs:   02/24/2022: TC 151, TG 57, HDL 52, LDL 88.  K+ 4.4, TSH 2.4 Lab Results  Component Value Date   CHOL 188 03/04/2021   HDL 57 03/04/2021   LDLCALC 118 (H) 03/04/2021   TRIG 68 03/04/2021   CHOLHDL 3.3 03/04/2021   Lab Results  Component Value Date   CREATININE 0.95 02/13/2020   BUN 5 (L) 02/13/2020   NA 140 02/13/2020   K 3.9 02/13/2020   CL 101 02/13/2020   CO2 30 02/13/2020      Latest Ref Rng & Units 03/04/2021   12:24 PM 03/04/2021   12:00 AM 02/13/2020   10:07 AM  CBC  WBC 3.4 - 10.8 x10E3/uL 4.1   3.0   Hemoglobin 11.1 - 15.9 g/dL 12.3   12.9   Hematocrit 34.0 - 46.6 % 35.7  36     37.8   Platelets 150 - 450  x10E3/uL 174  174     174.0      This result is from an external source.    Lab Results  Component Value Date   HGBA1C 5.5 03/04/2021   Lab Results  Component Value Date   TSH 2.030 11/21/2018    ================================================== I spent a total of 26 minutes with the patient spent in direct patient consultation.  => We discussed with her potentially using Kardia-Mobile if symptoms recur.  We also discussed triggers in detail.  Multiple questions were asked and answered. Additional time spent with chart review  / charting (studies, outside notes, etc): 15 min Total Time: 41 min  Current medicines are reviewed at length with the patient today.  (+/- concerns) n/a  Notice: This dictation was prepared with Dragon dictation along with smart phrase technology. Any transcriptional errors that result from this process are unintentional and may not be corrected upon review.  Studies Ordered:   No orders of the defined types were placed in this encounter.  No orders of the defined types were placed in this encounter.   Patient Instructions / Medication Changes & Studies & Tests Ordered   Patient Instructions  Medication Instructions:   No changes  *If you need a refill on your cardiac medications before your next appointment, please call your pharmacy*   Lab Work: Not needed I   Testing/Procedures:  Not needed  Follow-Up: At [facility], you and your health needs are our priority.  As part of our continuing mission to provide you with exceptional heart care, we have created designated Provider Care Teams.  These Care Teams include your primary Cardiologist (physician) and Advanced Practice Providers (APPs -  Physician Assistants and Nurse Practitioners) who all work together to provide you with the care you need, when you need it.     Your next appointment:   6 or 7 month(s)  The format for your next appointment:   In Person  Provider:   Shanon Brow  Ellyn Hack, MD       Leonie Man, MD, MS Glenetta Hew, M.D., M.S. Interventional Cardiologist  [facility]  Pager # [phone] Phone # [phone] [address] [place], [place] [address]   Thank you for choosing [facility]!!

## 2022-12-23 NOTE — Patient Instructions (Signed)
Medication Instructions:   No changes  *If you need a refill on your cardiac medications before your next appointment, please call your pharmacy*   Lab Work: Not needed I   Testing/Procedures:  Not needed  Follow-Up: At [facility], you and your health needs are our priority.  As part of our continuing mission to provide you with exceptional heart care, we have created designated Provider Care Teams.  These Care Teams include your primary Cardiologist (physician) and Advanced Practice Providers (APPs -  Physician Assistants and Nurse Practitioners) who all work together to provide you with the care you need, when you need it.     Your next appointment:   6 or 7 month(s)  The format for your next appointment:   In Person  Provider:   Glenetta Hew, MD

## 2023-01-03 ENCOUNTER — Encounter: Payer: Self-pay | Admitting: Cardiology

## 2023-01-03 NOTE — Assessment & Plan Note (Signed)
Thankfully, her episodes seem to diminished significantly.  Relatively short-lived and only 1 or 2 episodes in the last 3 months.  She did not tolerate a Zio patch and does not want to try another monitor.  I think at this point as infrequently as they are occurring, were probably better off of using Kardia-Mobile.  She will look into it if the symptoms recur. Otherwise we discussed avoiding triggers-staying hydrated and exercising.

## 2023-01-03 NOTE — Assessment & Plan Note (Signed)
DVT Dopplers performed, no DVT but there was evidence of reflux and a Baker's cyst.  Was referred back to orthopedics.  If she does have recurrent swelling in the leg potentially consider support stockings, and if significant could consider referral to [facility].

## 2023-01-13 ENCOUNTER — Ambulatory Visit: Payer: Medicare PPO | Admitting: Cardiology

## 2023-05-16 ENCOUNTER — Ambulatory Visit: Payer: Medicare PPO | Admitting: Podiatry

## 2023-05-16 ENCOUNTER — Ambulatory Visit (INDEPENDENT_AMBULATORY_CARE_PROVIDER_SITE_OTHER): Payer: Medicare PPO

## 2023-05-16 ENCOUNTER — Encounter: Payer: Self-pay | Admitting: Podiatry

## 2023-05-16 DIAGNOSIS — M2141 Flat foot [pes planus] (acquired), right foot: Secondary | ICD-10-CM

## 2023-05-16 DIAGNOSIS — M216X2 Other acquired deformities of left foot: Secondary | ICD-10-CM | POA: Diagnosis not present

## 2023-05-16 DIAGNOSIS — M2142 Flat foot [pes planus] (acquired), left foot: Secondary | ICD-10-CM

## 2023-05-16 DIAGNOSIS — M216X1 Other acquired deformities of right foot: Secondary | ICD-10-CM

## 2023-05-16 DIAGNOSIS — G629 Polyneuropathy, unspecified: Secondary | ICD-10-CM

## 2023-05-16 NOTE — Progress Notes (Signed)
  Subjective:  Patient ID: Teresa Mcknight, female    DOB: 11-25-1945,   MRN: [id number]  Chief Complaint  Patient presents with   arch    Patient states she has been having troubles with her knee and wonders if its related to her arch   Numbness     Bilateral numbness in feet     77 y.o. female presents for concern of bilateral flat feet. Relates her feet don't really have any pain but her knees have been a problem and was told inserts could help with her knee pain. Also relates she is a vegan and gets occasional numbness in her feet. She denies back pain . Denies any other pedal complaints. Denies n/v/f/c.   Past Medical History:  Diagnosis Date   Heart murmur    Palpitations 11/21/2018   Pancreatitis     Objective:  Physical Exam: Vascular: DP/PT pulses 2/4 bilateral. CFT <3 seconds. Normal hair growth on digits. No edema.  Skin. No lacerations or abrasions bilateral feet.  Musculoskeletal: MMT 5/5 bilateral lower extremities in DF, PF, Inversion and Eversion. Deceased ROM in DF of ankle joint. Mild collapse of medial arch bilateral with more eversion than inversion noted at subtalar joint.  Neurological: Sensation intact to light touch.   Assessment:   1. Bilateral pes planus   2. Neuropathy      Plan:  Patient was evaluated and treated and all questions answered. -Xrays reviewed -Discussed treatement options; discussed pes planus deformity;conservative and  surgical  -Discussed orthotics and with obtain prior authorization for them.  -Recommend good supportive shoes -Recommend daily stretching and icing -Discussed numbness and could be relates to back vs vitamin deficency relates she is vegan an not taking a vitamin supplement and discussed starting B12 supplement.  -Patient to return to office as needed or sooner if condition worsens.    Louann Sjogren, DPM

## 2023-06-07 ENCOUNTER — Telehealth: Payer: Self-pay

## 2023-06-14 NOTE — Telephone Encounter (Signed)
error 

## 2023-07-24 ENCOUNTER — Ambulatory Visit: Payer: Medicare PPO | Admitting: Cardiology

## 2023-08-02 DIAGNOSIS — Z6820 Body mass index (BMI) 20.0-20.9, adult: Secondary | ICD-10-CM | POA: Diagnosis not present

## 2023-08-02 DIAGNOSIS — R1900 Intra-abdominal and pelvic swelling, mass and lump, unspecified site: Secondary | ICD-10-CM | POA: Diagnosis not present

## 2023-08-02 DIAGNOSIS — A609 Anogenital herpesviral infection, unspecified: Secondary | ICD-10-CM | POA: Diagnosis not present

## 2023-08-02 DIAGNOSIS — Z1211 Encounter for screening for malignant neoplasm of colon: Secondary | ICD-10-CM | POA: Diagnosis not present

## 2023-08-02 DIAGNOSIS — Z01411 Encounter for gynecological examination (general) (routine) with abnormal findings: Secondary | ICD-10-CM | POA: Diagnosis not present

## 2023-08-02 DIAGNOSIS — Z1231 Encounter for screening mammogram for malignant neoplasm of breast: Secondary | ICD-10-CM | POA: Diagnosis not present

## 2023-08-02 DIAGNOSIS — Z139 Encounter for screening, unspecified: Secondary | ICD-10-CM | POA: Diagnosis not present

## 2023-08-02 DIAGNOSIS — Z01419 Encounter for gynecological examination (general) (routine) without abnormal findings: Secondary | ICD-10-CM | POA: Diagnosis not present

## 2023-08-02 DIAGNOSIS — M8580 Other specified disorders of bone density and structure, unspecified site: Secondary | ICD-10-CM | POA: Diagnosis not present

## 2023-08-28 DIAGNOSIS — H401131 Primary open-angle glaucoma, bilateral, mild stage: Secondary | ICD-10-CM | POA: Diagnosis not present

## 2023-10-16 DIAGNOSIS — N760 Acute vaginitis: Secondary | ICD-10-CM | POA: Diagnosis not present

## 2023-10-31 DIAGNOSIS — M25551 Pain in right hip: Secondary | ICD-10-CM | POA: Diagnosis not present

## 2023-10-31 DIAGNOSIS — M7061 Trochanteric bursitis, right hip: Secondary | ICD-10-CM | POA: Diagnosis not present

## 2023-12-06 DIAGNOSIS — M7061 Trochanteric bursitis, right hip: Secondary | ICD-10-CM | POA: Diagnosis not present

## 2023-12-06 DIAGNOSIS — M25651 Stiffness of right hip, not elsewhere classified: Secondary | ICD-10-CM | POA: Diagnosis not present

## 2023-12-06 DIAGNOSIS — R262 Difficulty in walking, not elsewhere classified: Secondary | ICD-10-CM | POA: Diagnosis not present

## 2023-12-06 DIAGNOSIS — M25551 Pain in right hip: Secondary | ICD-10-CM | POA: Diagnosis not present

## 2023-12-15 DIAGNOSIS — Z860100 Personal history of colon polyps, unspecified: Secondary | ICD-10-CM | POA: Diagnosis not present

## 2023-12-15 DIAGNOSIS — Z7184 Encounter for health counseling related to travel: Secondary | ICD-10-CM | POA: Diagnosis not present

## 2023-12-15 DIAGNOSIS — M8580 Other specified disorders of bone density and structure, unspecified site: Secondary | ICD-10-CM | POA: Diagnosis not present

## 2023-12-15 DIAGNOSIS — D696 Thrombocytopenia, unspecified: Secondary | ICD-10-CM | POA: Diagnosis not present

## 2023-12-15 DIAGNOSIS — D72819 Decreased white blood cell count, unspecified: Secondary | ICD-10-CM | POA: Diagnosis not present

## 2024-04-22 ENCOUNTER — Encounter: Payer: Self-pay | Admitting: Internal Medicine

## 2024-04-25 DIAGNOSIS — M899 Disorder of bone, unspecified: Secondary | ICD-10-CM | POA: Insufficient documentation

## 2024-04-25 DIAGNOSIS — N898 Other specified noninflammatory disorders of vagina: Secondary | ICD-10-CM | POA: Insufficient documentation

## 2024-04-25 DIAGNOSIS — R102 Pelvic and perineal pain: Secondary | ICD-10-CM | POA: Insufficient documentation

## 2024-04-25 DIAGNOSIS — Z8679 Personal history of other diseases of the circulatory system: Secondary | ICD-10-CM | POA: Insufficient documentation

## 2024-05-07 ENCOUNTER — Ambulatory Visit (AMBULATORY_SURGERY_CENTER)

## 2024-05-07 ENCOUNTER — Encounter: Payer: Self-pay | Admitting: Internal Medicine

## 2024-05-07 VITALS — Ht 65.5 in | Wt 105.0 lb

## 2024-05-07 DIAGNOSIS — Z860100 Personal history of colon polyps, unspecified: Secondary | ICD-10-CM

## 2024-05-07 NOTE — Progress Notes (Signed)
 Patient states she has excess mucous with egg intake; no soy allergy known to patient  No issues known to pt with past sedation with any surgeries or procedures Patient denies ever being told they had issues or difficulty with intubation  No FH of Malignant Hyperthermia Pt is not on diet pills nor GLP-1 medications Pt is not on home 02  Pt is not on blood thinners  Pt states occasional issue with chronic constipation; has increased fiber intake  No A fib or A flutter Have any cardiac testing pending--no Pt instructed to use Singlecare.[email or web address] or GoodRx for a price reduction on prep  Ambulates independently J. Nulty has reviewed chart

## 2024-05-07 NOTE — Addendum Note (Signed)
 Addended by: Vijay Durflinger on: 05/07/2024 02:03 PM   Modules accepted: Orders

## 2024-05-13 ENCOUNTER — Telehealth: Payer: Self-pay | Admitting: Internal Medicine

## 2024-05-13 DIAGNOSIS — Z860100 Personal history of colon polyps, unspecified: Secondary | ICD-10-CM

## 2024-05-13 MED ORDER — NA SULFATE-K SULFATE-MG SULF 17.5-3.13-1.6 GM/177ML PO SOLN
1.0000 | Freq: Once | ORAL | 0 refills | Status: AC
Start: 2024-05-13 — End: 2024-05-13

## 2024-05-13 NOTE — Telephone Encounter (Signed)
 Suprep sent to [facility] per pt request.  Pt called and notified.

## 2024-05-13 NOTE — Telephone Encounter (Signed)
 Patient is calling due to her not having received her prep medication. Patient stated that she would like her prep medication for her colonoscopy on July 24 th to be sent over to [facility] in [place]. Patient is requesting a call back and if she is not able to answer the phone to please leave a detailed VM. Please advise.

## 2024-05-16 ENCOUNTER — Telehealth: Payer: Self-pay | Admitting: Cardiology

## 2024-05-16 DIAGNOSIS — M25472 Effusion, left ankle: Secondary | ICD-10-CM

## 2024-05-16 NOTE — Telephone Encounter (Signed)
 Pt c/o swelling/edema: STAT if pt has developed SOB within 24 hours  If swelling, where is the swelling located?   Ankles and left foot  How much weight have you gained and in what time span?   No  Have you gained 2 pounds in a day or 5 pounds in a week?   No  Do you have a log of your daily weights (if so, list)?     105 pounds - 6/28 104 pounds - 6/29 106 pounds - 7/1 105 pounds - 7/2 103 pounds - 7/3  Are you currently taking a fluid pill?  No  Are you currently SOB?  No  Have you traveled recently in a car or plane for an extended period of time?  Not since April  Patient is concerned she has been having swelling in her ankle and left foot.  Patient also noted she sometimes has chest discomfort which lasts few seconds and goes away    Pt c/o of Chest Pain: STAT if active CP, including tightness, pressure, jaw pain, radiating pain to shoulder/upper arm/back, CP unrelieved by Nitro. Symptoms reported of SOB, nausea, vomiting, sweating.  1. Are you having CP right now?   No   2. Are you experiencing any other symptoms (ex. SOB, nausea, vomiting, sweating)?   Nausea  3. Is your CP continuous or coming and going?   Comes and goes  4. Have you taken Nitroglycerin? No  5. How long have you been experiencing CP?  3 weeks ago in the early early morning  6. If NO CP at time of call then end call with telling Pt to call back or call 911 if Chest pain returns prior to return call from triage team.

## 2024-05-16 NOTE — Telephone Encounter (Signed)
 Left ankle swelling       DVT Dopplers performed, no DVT but there was evidence of reflux and a Baker's cyst.  Was referred back to orthopedics.  If she does have recurrent swelling in the leg potentially consider support stockings, and if significant could consider referral to [facility].    The above information is from Dr Genice 12/23/22 office visit note.   Pt states both ankles and feet are swelling - the right one resolves overnight.  Left one stays swollen.  This has been going on for 3 weeks.  Tries to wear compression stocking daily but they are uncomfortable.  She reports being vegan and consumes a low sodium/no added salt diet.  Advised to continue current diet plan, daily wts, elevate during the day as much as possible, wear compression stockings as tolerated and that this information will be sent to Dr Anner for review.  She is aware she will be contacted with any further instructions.

## 2024-05-22 NOTE — Telephone Encounter (Signed)
 Left message for pt to call back to discuss Dr Genice recommendations and orders.

## 2024-05-22 NOTE — Telephone Encounter (Signed)
 I have not seen this patient in over a year and a half.  I do not really recall any specifics of his case.  We can order left lower extremity Dopplers to evaluate for DVT. If no DVT in his issues with venous stasis would probably benefit from [facility]VS consult.  Alm Clay, MD

## 2024-05-23 NOTE — Telephone Encounter (Signed)
 Spoke to patient Dr.Harding's advice given.Advised a scheduler will call back with doppler appointment.She will keep appointment with Dr.Harding 8/25 at 11:00 am.

## 2024-05-23 NOTE — Telephone Encounter (Signed)
Pt returning nurses call from yesterday. Please advise 

## 2024-05-23 NOTE — Telephone Encounter (Signed)
 Called patient left message on personal voice mail to call back.

## 2024-05-30 ENCOUNTER — Ambulatory Visit (HOSPITAL_COMMUNITY)
Admission: RE | Admit: 2024-05-30 | Discharge: 2024-05-30 | Disposition: A | Discharge status: Home / Self Care | Source: Ambulatory Visit | Attending: Cardiology | Admitting: Cardiology

## 2024-05-30 DIAGNOSIS — M25472 Effusion, left ankle: Secondary | ICD-10-CM | POA: Diagnosis not present

## 2024-05-31 ENCOUNTER — Ambulatory Visit: Payer: Self-pay | Admitting: Cardiology

## 2024-06-03 ENCOUNTER — Telehealth: Payer: Self-pay | Admitting: Internal Medicine

## 2024-06-03 ENCOUNTER — Telehealth: Payer: Self-pay | Admitting: Cardiology

## 2024-06-03 DIAGNOSIS — H40013 Open angle with borderline findings, low risk, bilateral: Secondary | ICD-10-CM | POA: Diagnosis not present

## 2024-06-03 NOTE — Telephone Encounter (Signed)
 Patient stated she is returning RN Sharon's call.

## 2024-06-03 NOTE — Telephone Encounter (Signed)
 Spoke to patient . Patient states she wanted Know  if she has clearance to have a colonoscopy.  Patient states the nurse From Dr Avram as her to contact office.   RN informed patient -the information will need to reviewed by Dr Anner .    Patient last visit cardiology  12/23/2022. A Patient has upcoming appointment 07/08/24.   RN informed patient will contact Dr Marge office for details.

## 2024-06-03 NOTE — Telephone Encounter (Signed)
 RN contact Dr Hale office      Pre-operative Risk Assessment    Patient Name: Teresa Mcknight  DOB: [birth year] MRN: [id number]   Date of last office visit: 12/23/22 Date of next office visit: 07/07/24   Request for Surgical Clearance    Procedure:  Colonscopy  Date of Surgery:  Clearance 06/06/24                                Surgeon:  Dr Lupita Commander Surgeon's Group or Practice Name: [facility]  Phone number:  [phone] Fax number:    Type of Clearance Requested:   - Medical    Type of Anesthesia:  proprofanol   Additional requests/questions:  Please advise surgeon/provider what medications should be held.  Bonney Gladis Reena LULLA   06/03/2024, 4:04 PM

## 2024-06-03 NOTE — Progress Notes (Signed)
 I have not seen the patient in over a year, but she does not have any history of ischemic CAD or heart failure.  Her main reason for seeing cardiology was palpitations.  She is not on any medications that would need to be held for colonoscopy, and colonoscopy is low risk procedure for this patient with no active symptoms.  No formal cardiac evaluation required.  Okay to proceed with colonoscopy or EGD.  Alm Clay, MD

## 2024-06-03 NOTE — Telephone Encounter (Signed)
 Informed patient Dr Anner  - gave her clearance for Colonoscopy   Patient verbalized understanding.

## 2024-06-03 NOTE — Telephone Encounter (Signed)
 Spoke with Teresa Mcknight from [facility] about the clearance for her colonoscopy. She needed to confirm the dates and stated that she would be placing a letter on the chart to confirm all necessary information.

## 2024-06-03 NOTE — Telephone Encounter (Signed)
Pt is calling to get results  °

## 2024-06-06 ENCOUNTER — Ambulatory Visit (AMBULATORY_SURGERY_CENTER): Admitting: Internal Medicine

## 2024-06-06 ENCOUNTER — Encounter: Payer: Self-pay | Admitting: Internal Medicine

## 2024-06-06 VITALS — BP 133/64 | HR 70 | Temp 97.3°F | Resp 15 | Ht 65.5 in | Wt 105.0 lb

## 2024-06-06 DIAGNOSIS — K5730 Diverticulosis of large intestine without perforation or abscess without bleeding: Secondary | ICD-10-CM | POA: Diagnosis not present

## 2024-06-06 DIAGNOSIS — Z860101 Personal history of adenomatous and serrated colon polyps: Secondary | ICD-10-CM | POA: Diagnosis not present

## 2024-06-06 DIAGNOSIS — Z1211 Encounter for screening for malignant neoplasm of colon: Secondary | ICD-10-CM | POA: Diagnosis not present

## 2024-06-06 DIAGNOSIS — Z860100 Personal history of colon polyps, unspecified: Secondary | ICD-10-CM

## 2024-06-06 MED ORDER — SODIUM CHLORIDE 0.9 % IV SOLN: 500.0000 mL | Freq: Once | INTRAVENOUS | Status: DC

## 2024-06-06 NOTE — Op Note (Signed)
 [facility] Patient Name: Teresa Mcknight Procedure Date: 06/06/2024 10:26 AM MRN: [id number] Endoscopist: Lupita FORBES Commander , MD, [id number] Age: 78 Referring MD:  Date of Birth: Jan 15, 1946 Gender: Female Account #: [id number] Procedure:                Colonoscopy Indications:              Surveillance: Personal history of adenomatous                            polyps on last colonoscopy 5 years ago, Last                            colonoscopy: 2020 Medicines:                Monitored Anesthesia Care Procedure:                Pre-Anesthesia Assessment:                           - Prior to the procedure, a History and Physical                            was performed, and patient medications and                            allergies were reviewed. The patient's tolerance of                            previous anesthesia was also reviewed. The risks                            and benefits of the procedure and the sedation                            options and risks were discussed with the patient.                            All questions were answered, and informed consent                            was obtained. Prior Anticoagulants: The patient has                            taken no anticoagulant or antiplatelet agents. ASA                            Grade Assessment: II - A patient with mild systemic                            disease. After reviewing the risks and benefits,                            the patient was deemed in satisfactory condition to  undergo the procedure.                           After obtaining informed consent, the colonoscope                            was passed under direct vision. Throughout the                            procedure, the patient's blood pressure, pulse, and                            oxygen saturations were monitored continuously. The                            Olympus Scope SN [id number] was introduced through  the                            anus and advanced to the the cecum, identified by                            appendiceal orifice and ileocecal valve. The                            colonoscopy was performed without difficulty. The                            patient tolerated the procedure well. The quality                            of the bowel preparation was good. The ileocecal                            valve, appendiceal orifice, and rectum were                            photographed. Scope In: 10:50:45 AM Scope Out: 11:04:04 AM Scope Withdrawal Time: 0 hours 11 minutes 11 seconds  Total Procedure Duration: 0 hours 13 minutes 19 seconds  Findings:                 The perianal and digital rectal examinations were                            normal.                           Multiple diverticula were found in the sigmoid                            colon.                           The exam was otherwise without abnormality on  direct and retroflexion views. Complications:            No immediate complications. Estimated Blood Loss:     Estimated blood loss: none. Impression:               - Diverticulosis in the sigmoid colon.                           - The examination was otherwise normal on direct                            and retroflexion views.                           - No specimens collected.                           - Personal history of colonic polyps. - adenomas on                            colonoscopy with Dr. Kristie 2014                           - 2 tubular adenomas on colonoscopy Dr. Celestia 2020 Recommendation:           - Patient has a contact number available for                            emergencies. The signs and symptoms of potential                            delayed complications were discussed with the                            patient. Return to normal activities tomorrow.                            Written discharge instructions  were provided to the                            patient.                           - Resume previous diet.                           - Continue present medications.                           - No repeat colonoscopy due to age and the absence                            of colonic polyps. Lupita FORBES Commander, MD 06/06/2024 11:14:02 AM This report has been signed electronically.

## 2024-06-06 NOTE — Progress Notes (Signed)
 A/o x 3, VSS, gd SR's, pleased with anesthesia, report to RN

## 2024-06-06 NOTE — Progress Notes (Signed)
 [facility] History and Physical   Primary Care Physician:  Stephane Leita DEL, MD   Reason for Procedure:    Encounter Diagnosis  Name Primary?   Hx of colonic polyps Yes     Plan:    colonoscopy     HPI: Teresa Mcknight is a 78 y.o. female here for surveillance colonoscopy  Personal history of polyps    - adenomas on colonoscopy with Dr. Kristie 2014    - 2 tubular adenomas on colonoscopy Dr. Celestia 2020    - Dr. Celestia recommended surveillance colonoscopy in 2025  Also has hx pancreatitis and dilated pancreatic duct - she declined EUS and says she is w/o problems and still does not want repeat imaging or testing Past Medical History:  Diagnosis Date   Heart murmur    Palpitations 11/21/2018   Pancreatitis     Past Surgical History:  Procedure Laterality Date   COLONOSCOPY  2020   Dr Celestia [facility]   KNEE ARTHROSCOPY Left 2021    Prior to Admission medications   Medication Sig Start Date End Date Taking? Authorizing Provider  Multiple Vitamin (MULTI-VITAMIN) tablet Take 1 tablet by mouth daily.   Yes [provider]  acyclovir  (ZOVIRAX ) 400 MG tablet TAKE 1 TABLET THREE TIMES DAILY AS NEEDED FOR FLARE-UPS. START WITHIN 48 HRS OF SYMPTOMS ONSET AND CONTINUE FOR 5 DAYS. Patient taking differently: 400 mg daily as needed. 09/29/22   Mercer Clotilda SAUNDERS, MD  Ascorbic Acid (VITA-C PO) Take 1 capsule by mouth daily at 6 (six) AM.    [provider]  atovaquone-proguanil (MALARONE) 250-100 MG TABS tablet START TAKING 1 TABLET BY MOUTH DAILY 1 TO 2 DAYS BEFORE TRAVEL CONTINUE IN COUNTRY AND FOR 1 WEEK AFTER YOU RETURN    [provider]  bimatoprost (LATISSE) 0.03 % ophthalmic solution APPLY SOLUTION TOPICALLY INTO EACH EYE AT BEDTIME Patient not taking: No sig reported    [provider]  ibuprofen (ADVIL) 600 MG tablet Take 600 mg by mouth every 6 (six) hours as needed. 06/08/22   [provider]  Multiple Vitamins-Minerals  (HAIR SKIN AND NAILS FORMULA PO)     [provider]    Current Outpatient Medications  Medication Sig Dispense Refill   Multiple Vitamin (MULTI-VITAMIN) tablet Take 1 tablet by mouth daily.     acyclovir  (ZOVIRAX ) 400 MG tablet TAKE 1 TABLET THREE TIMES DAILY AS NEEDED FOR FLARE-UPS. START WITHIN 48 HRS OF SYMPTOMS ONSET AND CONTINUE FOR 5 DAYS. (Patient taking differently: 400 mg daily as needed.) 30 tablet 0   Ascorbic Acid (VITA-C PO) Take 1 capsule by mouth daily at 6 (six) AM.     atovaquone-proguanil (MALARONE) 250-100 MG TABS tablet START TAKING 1 TABLET BY MOUTH DAILY 1 TO 2 DAYS BEFORE TRAVEL CONTINUE IN COUNTRY AND FOR 1 WEEK AFTER YOU RETURN     bimatoprost (LATISSE) 0.03 % ophthalmic solution APPLY SOLUTION TOPICALLY INTO EACH EYE AT BEDTIME (Patient not taking: No sig reported)     ibuprofen (ADVIL) 600 MG tablet Take 600 mg by mouth every 6 (six) hours as needed.     Multiple Vitamins-Minerals (HAIR SKIN AND NAILS FORMULA PO)      Current Facility-Administered Medications  Medication Dose Route Frequency Provider Last Rate Last Admin   0.9 %  sodium chloride  infusion  500 mL Intravenous Once Avram Lupita BRAVO, MD        Allergies as of 06/06/2024 - Review Complete 06/06/2024  Allergen Reaction Noted  Aspirin Other (See Comments) 06/20/2019   Valtrex [valacyclovir hcl] Nausea And Vomiting 06/20/2019   Latex Itching 06/20/2019    Family History  Problem Relation Age of Onset   Dementia Mother    Stroke Mother    Stroke Father    Colon polyps Sister    Multiple myeloma Sister    Hyperthyroidism Sister    Multiple myeloma Brother    Hypothyroidism Brother    Dementia Maternal Grandmother    Cancer Neg Hx    Diabetes Neg Hx    Colon cancer Neg Hx    Esophageal cancer Neg Hx    Rectal cancer Neg Hx    Stomach cancer Neg Hx     Social History   Socioeconomic History   Marital status: Single    Spouse name: Not on file   Number of children: 0   Years  of education: Not on file   Highest education level: Not on file  Occupational History   Occupation: retired  Tobacco Use   Smoking status: Never   Smokeless tobacco: Never  Vaping Use   Vaping status: Never Used  Substance and Sexual Activity   Alcohol use: No   Drug use: No   Sexual activity: Not on file  Other Topics Concern   Not on file  Social History Narrative   Not on file   Social Drivers of Health   Financial Resource Strain: Low Risk  (11/03/2020)   Overall Financial Resource Strain (CARDIA)    Difficulty of Paying Living Expenses: Not hard at all  Food Insecurity: No Food Insecurity (11/03/2020)   Hunger Vital Sign    Worried About Running Out of Food in the Last Year: Never true    Ran Out of Food in the Last Year: Never true  Transportation Needs: No Transportation Needs (11/03/2020)   PRAPARE - Administrator, Civil Service (Medical): No    Lack of Transportation (Non-Medical): No  Physical Activity: Sufficiently Active (11/03/2020)   Exercise Vital Sign    Days of Exercise per Week: 5 days    Minutes of Exercise per Session: 60 min  Stress: No Stress Concern Present (11/03/2020)   [employer] of Occupational Health - Occupational Stress Questionnaire    Feeling of Stress : Not at all  Social Connections: Moderately Isolated (11/03/2020)   Social Connection and Isolation Panel    Frequency of Communication with Friends and Family: More than three times a week    Frequency of Social Gatherings with Friends and Family: More than three times a week    Attends Religious Services: 1 to 4 times per year    Active Member of [employer] or Organizations: No    Attends Banker Meetings: Never    Marital Status: Never married  Intimate Partner Violence: Not At Risk (11/03/2020)   Humiliation, Afraid, Rape, and Kick questionnaire    Fear of Current or Ex-Partner: No    Emotionally Abused: No    Physically Abused: No    Sexually Abused: No     Review of Systems:  All other review of systems negative except as mentioned in the HPI.  Physical Exam: Vital signs BP 126/70   Pulse 63   Temp (!) 97.3 F (36.3 C) (Temporal)   Resp 17   Ht 5' 5.5 (1.664 m)   Wt 105 lb (47.6 kg)   SpO2 100%   BMI 17.21 kg/m   General:   Alert,  Well-developed, well-nourished, pleasant and cooperative  in NAD Lungs:  Clear throughout to auscultation.   Heart:  Regular rate and rhythm; no murmurs, clicks, rubs,  or gallops. Abdomen:  Soft, nontender and nondistended. Normal bowel sounds.   Neuro/Psych:  Alert and cooperative. Normal mood and affect. A and O x 3   @Jakarius Flamenco  CHARLENA Commander, MD, [facility] [phone] (pager) 06/06/2024 10:36 AM@

## 2024-06-06 NOTE — Patient Instructions (Addendum)
 No polyps or cancer were seen. You do have some diverticulosis - please read the handout.  No need for any more routine colonoscopy testing.  I appreciate the opportunity to care for you. Lupita CHARLENA Commander, MD, [facility]  Resume previous diet Continue present medications There were no colon polyps seen today!  You will NOT need another screening colonoscopy HOWEVER,    Please call DR GESSNER'S OFFICE at [phone] if you have a change in bowel habits, change in family history of colo-rectal cancer, rectal bleeding or other GI concern IN THE FUTURE  Handouts/information given for diverticulosis    YOU HAD AN ENDOSCOPIC PROCEDURE TODAY AT THE [facility]:   Refer to the procedure report that was given to you for any specific questions about what was found during the examination.  If the procedure report does not answer your questions, please call your gastroenterologist to clarify.  If you requested that your care partner not be given the details of your procedure findings, then the procedure report has been included in a sealed envelope for you to review at your convenience later.  YOU SHOULD EXPECT: Some feelings of bloating in the abdomen. Passage of more gas than usual.  Walking can help get rid of the air that was put into your GI tract during the procedure and reduce the bloating. If you had a lower endoscopy (such as a colonoscopy or flexible sigmoidoscopy) you may notice spotting of blood in your stool or on the toilet paper. If you underwent a bowel prep for your procedure, you may not have a normal bowel movement for a few days.  Please Note:  You might notice some irritation and congestion in your nose or some drainage.  This is from the oxygen used during your procedure.  There is no need for concern and it should clear up in a day or so.  SYMPTOMS TO REPORT IMMEDIATELY:  Following lower endoscopy (colonoscopy):  Excessive amounts of blood in the stool  Significant  tenderness or worsening of abdominal pains  Swelling of the abdomen that is new, acute  Fever of 100F or higher For urgent or emergent issues, a gastroenterologist can be reached at any hour by calling [phone]. Do not use MyChart messaging for urgent concerns.   DIET:  We do recommend a small meal at first, but then you may proceed to your regular diet.  Drink plenty of fluids but you should avoid alcoholic beverages for 24 hours.  ACTIVITY:  You should plan to take it easy for the rest of today and you should NOT DRIVE or use heavy machinery until tomorrow (because of the sedation medicines used during the test).    FOLLOW UP: Our staff will call the number listed on your records the next business day following your procedure.  We will call around 7:15- 8:00 am to check on you and address any questions or concerns that you may have regarding the information given to you following your procedure. If we do not reach you, we will leave a message.      SIGNATURES/CONFIDENTIALITY: You and/or your care partner have signed paperwork which will be entered into your electronic medical record.  These signatures attest to the fact that that the information above on your After Visit Summary has been reviewed and is understood.  Full responsibility of the confidentiality of this discharge information lies with you and/or your care-partner.

## 2024-06-06 NOTE — Progress Notes (Signed)
 Pt's states no medical or surgical changes since previsit or office visit.

## 2024-06-07 ENCOUNTER — Telehealth: Payer: Self-pay | Admitting: *Deleted

## 2024-06-07 NOTE — Telephone Encounter (Signed)
  Follow up Call-     06/06/2024    9:58 AM  Call back number  Post procedure Call Back phone  # [phone]  Permission to leave phone message Yes     Patient questions:  Do you have a fever, pain , or abdominal swelling? No. Pain Score  0 *  Have you tolerated food without any problems? Yes.    Have you been able to return to your normal activities? Yes.    Do you have any questions about your discharge instructions: Diet   No. Medications  No. Follow up visit  No.  Do you have questions or concerns about your Care? No.  Actions: * If pain score is 4 or above: No action needed, pain <4.

## 2024-06-19 ENCOUNTER — Telehealth: Payer: Self-pay | Admitting: Cardiology

## 2024-06-19 NOTE — Telephone Encounter (Signed)
 Patient is calling about her upcoming appt on 8/25, she wants to know if she still needs to come for that appt.  She said she had test done and everything came back good. She would like to speak with nurse Reena

## 2024-06-19 NOTE — Telephone Encounter (Signed)
 Spoke to patient she wanted to ask Dr.Harding if she needs to keep follow up appointment scheduled this month since all her test came back normal.She wanted to know if she needs to see a ankle and foot Dr.instead of heart Dr.She has had swelling in left foot since her recent colonoscopy.Message sent to Dr.Harding for advice.

## 2024-06-20 NOTE — Telephone Encounter (Signed)
 Despite findings she does not come in because I have not seen her in a long time.  This was just a follow-up, and I been getting questions about her so I thought it was reasonable to see her, but if she is doing fine I think we can cancel the appointment if she would like.

## 2024-06-21 NOTE — Telephone Encounter (Signed)
 Spoke to patient Dr.Harding's advice given.She stated she will keep appointment with Dr.Harding 8/25 at 11:00 am.

## 2024-06-24 DIAGNOSIS — D72819 Decreased white blood cell count, unspecified: Secondary | ICD-10-CM | POA: Diagnosis not present

## 2024-06-24 DIAGNOSIS — N1832 Chronic kidney disease, stage 3b: Secondary | ICD-10-CM | POA: Diagnosis not present

## 2024-06-24 DIAGNOSIS — E785 Hyperlipidemia, unspecified: Secondary | ICD-10-CM | POA: Diagnosis not present

## 2024-06-24 DIAGNOSIS — M8580 Other specified disorders of bone density and structure, unspecified site: Secondary | ICD-10-CM | POA: Diagnosis not present

## 2024-06-24 DIAGNOSIS — E7849 Other hyperlipidemia: Secondary | ICD-10-CM | POA: Diagnosis not present

## 2024-06-24 DIAGNOSIS — Z0000 Encounter for general adult medical examination without abnormal findings: Secondary | ICD-10-CM | POA: Diagnosis not present

## 2024-06-25 DIAGNOSIS — N183 Chronic kidney disease, stage 3 unspecified: Secondary | ICD-10-CM | POA: Diagnosis not present

## 2024-06-25 DIAGNOSIS — R946 Abnormal results of thyroid function studies: Secondary | ICD-10-CM | POA: Diagnosis not present

## 2024-06-25 DIAGNOSIS — Z0000 Encounter for general adult medical examination without abnormal findings: Secondary | ICD-10-CM | POA: Diagnosis not present

## 2024-06-26 DIAGNOSIS — M8589 Other specified disorders of bone density and structure, multiple sites: Secondary | ICD-10-CM | POA: Diagnosis not present

## 2024-06-28 DIAGNOSIS — M25511 Pain in right shoulder: Secondary | ICD-10-CM | POA: Diagnosis not present

## 2024-06-28 DIAGNOSIS — Z1231 Encounter for screening mammogram for malignant neoplasm of breast: Secondary | ICD-10-CM | POA: Diagnosis not present

## 2024-06-28 DIAGNOSIS — M7541 Impingement syndrome of right shoulder: Secondary | ICD-10-CM | POA: Diagnosis not present

## 2024-07-02 DIAGNOSIS — Z1331 Encounter for screening for depression: Secondary | ICD-10-CM | POA: Diagnosis not present

## 2024-07-02 DIAGNOSIS — D649 Anemia, unspecified: Secondary | ICD-10-CM | POA: Diagnosis not present

## 2024-07-02 DIAGNOSIS — Z1339 Encounter for screening examination for other mental health and behavioral disorders: Secondary | ICD-10-CM | POA: Diagnosis not present

## 2024-07-02 DIAGNOSIS — N183 Chronic kidney disease, stage 3 unspecified: Secondary | ICD-10-CM | POA: Diagnosis not present

## 2024-07-02 DIAGNOSIS — E039 Hypothyroidism, unspecified: Secondary | ICD-10-CM | POA: Diagnosis not present

## 2024-07-02 DIAGNOSIS — D696 Thrombocytopenia, unspecified: Secondary | ICD-10-CM | POA: Diagnosis not present

## 2024-07-02 DIAGNOSIS — N1832 Chronic kidney disease, stage 3b: Secondary | ICD-10-CM | POA: Diagnosis not present

## 2024-07-02 DIAGNOSIS — N390 Urinary tract infection, site not specified: Secondary | ICD-10-CM | POA: Diagnosis not present

## 2024-07-02 DIAGNOSIS — D72819 Decreased white blood cell count, unspecified: Secondary | ICD-10-CM | POA: Diagnosis not present

## 2024-07-02 DIAGNOSIS — Z0000 Encounter for general adult medical examination without abnormal findings: Secondary | ICD-10-CM | POA: Diagnosis not present

## 2024-07-02 DIAGNOSIS — M8580 Other specified disorders of bone density and structure, unspecified site: Secondary | ICD-10-CM | POA: Diagnosis not present

## 2024-07-08 ENCOUNTER — Ambulatory Visit: Attending: Cardiology | Admitting: Cardiology

## 2024-07-08 ENCOUNTER — Encounter: Payer: Self-pay | Admitting: Cardiology

## 2024-07-08 VITALS — BP 148/70 | HR 65 | Ht 61.0 in | Wt 110.8 lb

## 2024-07-08 DIAGNOSIS — M25472 Effusion, left ankle: Secondary | ICD-10-CM | POA: Diagnosis not present

## 2024-07-08 DIAGNOSIS — M25551 Pain in right hip: Secondary | ICD-10-CM | POA: Diagnosis not present

## 2024-07-08 DIAGNOSIS — M7061 Trochanteric bursitis, right hip: Secondary | ICD-10-CM | POA: Diagnosis not present

## 2024-07-08 DIAGNOSIS — R002 Palpitations: Secondary | ICD-10-CM | POA: Diagnosis not present

## 2024-07-08 NOTE — Progress Notes (Signed)
 Cardiology Office Note:  .   Date:  07/15/2024  ID:  Samantha Hint, DOB 01/23/1946, MRN [id number] PCP: Stephane Leita DEL, MD  [facility] Providers Cardiologist:  Alm Clay, MD     Chief Complaint  Patient presents with   Follow-up    Reevaluate leg swelling    Patient Profile: .     Teresa Mcknight is a  77 y.o. female  with a PMH notable for palpitations who presents here for reevaluation with left leg swelling.  Referred at the request of Stephane Leita DEL, MD.     Azaylea Maves was last seen on December 23, 2022 for follow-up of irregular heartbeats/palpitations and left lower extremity swelling.  In December 2023, she was noted to have short segment reflux in the left GSV with mild to moderate suprapatellar fluid consistent with Baker's cyst.  She was supposed to wear Zio patch monitor but had issues with intolerance of the adhesive.  She got a skin burn.  We recommended Kardia-Mobile at the time.  Her leg swelling was thought to be related to Baker's cyst.  Referred to orthopedics.  Also discussed venous reflux.  Recommended support socks but low threshold to consider VVS referral.  For palpitations we simply recommended monitoring with Kardia-Mobile if they recurred and expectant management otherwise.  Subjective  Discussed the use of AI scribe software for clinical note transcription with the patient, who gave verbal consent to proceed.  History of Present Illness Amantha Sklar is a 78 year old female who presents with left leg swelling.  She has been experiencing swelling in her left leg, which started again recently. The swelling was significant on the 14th but has since decreased. It is primarily in the left leg, which is the leg that usually swells, and she describes a tight feeling in the area. The swelling tends to occur during the day and decreases at night when her leg is elevated.  A leg Doppler study was performed approximately two to three weeks ago, on  July 18th, which showed no evidence of a clot but indicated the presence of a Baker's cyst. She has had a Baker's cyst before, which can occasionally put pressure on the vein and decrease blood flow, leading to swelling.  No shortness of breath when lying flat, waking up short of breath, or experiencing shortness of breath during activities. No chest discomfort or leg discomfort while walking.  She is not currently on any medications regularly but was prescribed meloxicam for shoulder pain a few weeks ago, which she has not taken due to concerns about side effects.    Objective   Not on any CV medications  Studies Reviewed: SABRA   EKG Interpretation Date/Time:  Monday July 08 2024 10:58:39 EDT Ventricular Rate:  65 PR Interval:  122 QRS Duration:  80 QT Interval:  404 QTC Calculation: 420 R Axis:   48  Text Interpretation: Normal sinus rhythm Normal ECG When compared with ECG of 20-Jun-2019 09:48, No significant change was found Confirmed by Clay Alm ([id number]) on 07/08/2024 12:03:19 PM    Lipids not been checked since April 2023 with an LDL of 88 and total cholesterol 151. Results RADIOLOGY Leg Doppler Ultrasound: No evidence of deep vein thrombosis, possible Baker's cyst, venous reflux in the short saphenous vein, complex Baker's cyst in the left greater saphenous vein (05/31/2024) Similar to 2023.  Risk Assessment/Calculations:           Physical Exam:   VS:  BP (!) 148/70 (  BP Location: Right Arm, Patient Position: Sitting, Cuff Size: Normal)   Pulse 65   Ht 5' 1 (1.549 m)   Wt 110 lb 12.8 oz (50.3 kg)   SpO2 94%   BMI 20.94 kg/m    Wt Readings from Last 3 Encounters:  07/08/24 110 lb 12.8 oz (50.3 kg)  06/06/24 105 lb (47.6 kg)  05/07/24 105 lb (47.6 kg)      GEN: Well nourished, well groomed in no acute distress; healthy-appearing NECK: No JVD; No carotid bruits CARDIAC: Normal S1, S2; RRR, no murmurs, rubs, gallops RESPIRATORY:  Clear to auscultation without  rales, wheezing or rhonchi ; nonlabored, good air movement. ABDOMEN: Soft, non-tender, non-distended EXTREMITIES:  No edema; No deformity      ASSESSMENT AND PLAN: .    Problem List Items Addressed This Visit     Left ankle swelling (Chronic)   Swelling in the left leg due to Baker's cyst and chronic venous insufficiency. Doppler studies negative for DVT. Baker's cyst may compress vein intermittently. No cardiac-related symptoms. Venous reflux in short saphenous vein noted. - Advise compression stockings during prolonged standing or travel. - Recommend leg elevation with knee bent to reduce swelling. - Suggest support socks in winter or during extended standing. - Consider orthopedic referral for Baker's cyst drainage if swelling worsens. - Consider vascular referral for ablation if venous insufficiency worsens.      Palpitations - Primary (Chronic)   Palpitations generally well-controlled.  No major issue at this point.  Continue to monitor.  Discussed Kardia-Mobile use if indicated.      Relevant Orders   EKG 12-Lead (Completed)           Follow-Up: Return if symptoms worsen or fail to improve, for Followup when necessary, [employer].     Signed, Alm MICAEL Clay, MD, MS Alm Clay, M.D., M.S. Interventional Chartered certified accountant  Pager # [phone]

## 2024-07-08 NOTE — Patient Instructions (Signed)
 Medication Instructions:   No changes  Lab Work: Not needed    Testing/Procedures:  Not needed  Follow-Up: At [facility], you and your health needs are our priority.  As part of our continuing mission to provide you with exceptional heart care, we have created designated Provider Care Teams.  These Care Teams include your primary Cardiologist (physician) and Advanced Practice Providers (APPs -  Physician Assistants and Nurse Practitioners) who all work together to provide you with the care you need, when you need it.     Your next appointment:   As needed   The format for your next appointment:   In Person  Provider:   Alm Clay, MD   Other Instructions    Suggest you contact an Orthopedists to check Baker's Cyst

## 2024-07-15 ENCOUNTER — Encounter: Payer: Self-pay | Admitting: Cardiology

## 2024-07-15 NOTE — Assessment & Plan Note (Signed)
 Palpitations generally well-controlled.  No major issue at this point.  Continue to monitor.  Discussed Kardia-Mobile use if indicated.

## 2024-07-15 NOTE — Assessment & Plan Note (Signed)
 Swelling in the left leg due to Baker's cyst and chronic venous insufficiency. Doppler studies negative for DVT. Baker's cyst may compress vein intermittently. No cardiac-related symptoms. Venous reflux in short saphenous vein noted. - Advise compression stockings during prolonged standing or travel. - Recommend leg elevation with knee bent to reduce swelling. - Suggest support socks in winter or during extended standing. - Consider orthopedic referral for Baker's cyst drainage if swelling worsens. - Consider vascular referral for ablation if venous insufficiency worsens.

## 2024-07-18 DIAGNOSIS — M25511 Pain in right shoulder: Secondary | ICD-10-CM | POA: Diagnosis not present

## 2024-09-10 DIAGNOSIS — Z01419 Encounter for gynecological examination (general) (routine) without abnormal findings: Secondary | ICD-10-CM | POA: Diagnosis not present

## 2024-09-10 DIAGNOSIS — Z6820 Body mass index (BMI) 20.0-20.9, adult: Secondary | ICD-10-CM | POA: Diagnosis not present

## 2024-10-02 ENCOUNTER — Other Ambulatory Visit: Payer: Self-pay | Admitting: Internal Medicine

## 2024-10-02 DIAGNOSIS — N2889 Other specified disorders of kidney and ureter: Secondary | ICD-10-CM

## 2024-10-09 ENCOUNTER — Ambulatory Visit
Admission: RE | Admit: 2024-10-09 | Discharge: 2024-10-09 | Disposition: A | Source: Ambulatory Visit | Attending: Internal Medicine

## 2024-10-09 DIAGNOSIS — N2889 Other specified disorders of kidney and ureter: Secondary | ICD-10-CM

## 2024-10-09 DIAGNOSIS — N1832 Chronic kidney disease, stage 3b: Secondary | ICD-10-CM | POA: Diagnosis not present

## 2024-10-09 DIAGNOSIS — N281 Cyst of kidney, acquired: Secondary | ICD-10-CM | POA: Diagnosis not present

## 2024-10-14 ENCOUNTER — Other Ambulatory Visit

## 2024-10-14 DIAGNOSIS — R928 Other abnormal and inconclusive findings on diagnostic imaging of breast: Secondary | ICD-10-CM | POA: Diagnosis not present

## 2024-10-14 DIAGNOSIS — N6489 Other specified disorders of breast: Secondary | ICD-10-CM | POA: Diagnosis not present

## 2024-10-21 DIAGNOSIS — E785 Hyperlipidemia, unspecified: Secondary | ICD-10-CM | POA: Diagnosis not present

## 2024-10-21 DIAGNOSIS — N289 Disorder of kidney and ureter, unspecified: Secondary | ICD-10-CM | POA: Diagnosis not present

## 2024-10-21 DIAGNOSIS — N184 Chronic kidney disease, stage 4 (severe): Secondary | ICD-10-CM | POA: Diagnosis not present

## 2024-10-21 DIAGNOSIS — D631 Anemia in chronic kidney disease: Secondary | ICD-10-CM | POA: Diagnosis not present

## 2024-10-31 ENCOUNTER — Other Ambulatory Visit (HOSPITAL_COMMUNITY): Payer: Self-pay | Admitting: Nephrology

## 2024-10-31 DIAGNOSIS — N281 Cyst of kidney, acquired: Secondary | ICD-10-CM

## 2024-11-04 ENCOUNTER — Ambulatory Visit (HOSPITAL_COMMUNITY)

## 2024-11-04 ENCOUNTER — Encounter (HOSPITAL_COMMUNITY): Payer: Self-pay

## 2024-12-10 ENCOUNTER — Inpatient Hospital Stay

## 2024-12-10 ENCOUNTER — Inpatient Hospital Stay: Admitting: Hematology and Oncology

## 2024-12-17 ENCOUNTER — Encounter: Payer: Self-pay | Admitting: Nephrology

## 2024-12-31 ENCOUNTER — Inpatient Hospital Stay: Admitting: Hematology and Oncology

## 2024-12-31 ENCOUNTER — Inpatient Hospital Stay
